# Patient Record
Sex: Female | Born: 1990 | Race: White | Hispanic: No | Marital: Single | Smoking: Never smoker
Health system: Southern US, Community
[De-identification: ages and names within clinical notes are randomized; demographics above are authoritative.]

## PROBLEM LIST (undated history)

## (undated) DIAGNOSIS — F909 Attention-deficit hyperactivity disorder, unspecified type: Secondary | ICD-10-CM

## (undated) DIAGNOSIS — F840 Autistic disorder: Secondary | ICD-10-CM

## (undated) DIAGNOSIS — Z975 Presence of (intrauterine) contraceptive device: Secondary | ICD-10-CM

## (undated) DIAGNOSIS — F429 Obsessive-compulsive disorder, unspecified: Secondary | ICD-10-CM

## (undated) DIAGNOSIS — F419 Anxiety disorder, unspecified: Secondary | ICD-10-CM

## (undated) HISTORY — DX: Presence of (intrauterine) contraceptive device: Z97.5

## (undated) HISTORY — DX: Attention-deficit hyperactivity disorder, unspecified type: F90.9

## (undated) HISTORY — DX: Obsessive-compulsive disorder, unspecified: F42.9

## (undated) HISTORY — DX: Anxiety disorder, unspecified: F41.9

---
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
## 2010-06-14 ENCOUNTER — Other Ambulatory Visit: Payer: Self-pay | Admitting: Internal Medicine

## 2010-06-14 DIAGNOSIS — R569 Unspecified convulsions: Secondary | ICD-10-CM

## 2010-06-21 ENCOUNTER — Ambulatory Visit (HOSPITAL_COMMUNITY)
Admission: RE | Admit: 2010-06-21 | Discharge: 2010-06-21 | Disposition: A | Discharge status: Home / Self Care | Payer: BLUE CROSS/BLUE SHIELD | Source: Ambulatory Visit | Attending: Neurology | Admitting: Neurology

## 2010-06-21 DIAGNOSIS — R569 Unspecified convulsions: Secondary | ICD-10-CM | POA: Insufficient documentation

## 2010-06-21 DIAGNOSIS — Z1389 Encounter for screening for other disorder: Secondary | ICD-10-CM | POA: Insufficient documentation

## 2010-06-23 NOTE — Procedures (Unsigned)
EEG NUMBER:  HISTORY:  This is a 19 year old woman with Asperger syndrome who had seizures from the age of 36 until middle school and had been seizures free being on Depakote since middle school.  EEG has been performed for further evaluation.  LIST OF MEDICATIONS:  None.  EEG RECORDING TIME:  25.5 minutes of EEG recording.  EEG DESCRIPTION:  This is a routine 18-channel adult EEG recording with one channel devoted to limited EKG recording.  Activation procedure was performed using the hyperventilation and photic stimulation, and the study is performed in the awake state. As the EEG opens up, I noticed that the posterior dominant rhythm is well formed into 9 Hz ranges and amplitude ranging from 10 to 25 microvolts.  The rhythm is symmetrical and continuous.  I do notice the build up of generalized high-amplitude relative slowing with the hyperventilation but it did not bring on any epileptogenic activity. Symmetrical driving was noted with the photic stimulation in posterior leads.  There are no electrographic seizures or epileptiform discharges recorded during this study.  There is no finding suggestive of pattern pointing towards the possibility of primary generalized epilepsy seen on this study either.  EEG INTERPRETATION:  This is a normal awake EEG.  A normal EEG does not rule out the possibility of having seizure disorder; therefore, clinical correlation is advised.          ______________________________ Levie Heritage, MD    ZO:[id number] D:  06/22/2010 18:08:20  T:  06/23/2010 04:57:16  Job #:  [id number]

## 2010-07-05 ENCOUNTER — Other Ambulatory Visit: Payer: BLUE CROSS/BLUE SHIELD

## 2016-07-14 ENCOUNTER — Encounter: Payer: Self-pay | Admitting: Obstetrics & Gynecology

## 2016-07-14 ENCOUNTER — Ambulatory Visit (INDEPENDENT_AMBULATORY_CARE_PROVIDER_SITE_OTHER): Payer: Medicaid Other | Admitting: Obstetrics & Gynecology

## 2016-07-14 VITALS — BP 118/78 | Ht 64.0 in | Wt 117.0 lb

## 2016-07-14 DIAGNOSIS — Z01419 Encounter for gynecological examination (general) (routine) without abnormal findings: Secondary | ICD-10-CM

## 2016-07-14 DIAGNOSIS — Z3046 Encounter for surveillance of implantable subdermal contraceptive: Secondary | ICD-10-CM | POA: Diagnosis not present

## 2016-07-14 DIAGNOSIS — Z0000 Encounter for general adult medical examination without abnormal findings: Secondary | ICD-10-CM | POA: Diagnosis not present

## 2016-07-14 NOTE — Progress Notes (Signed)
    Michelle Wong January 07, 1991 [id number]   History:    26 y.o. G0 Autistic.  Nexplanon x 11/2014  RP: Established patient for annual gyn exam   HPI:  Menses light q1-3 months.  Mild dysmeno using Tylenol PRN.  No Pelvic Pain.  No vaginal d/c.  Patient report being sexually active once in her life (private information).    Past medical history,surgical history, family history and social history were all reviewed and documented in the EPIC chart.  Gynecologic History No LMP recorded. Contraception: Nexplanon Last Pap: 10/2014. Results were: normal Last mammogram: Never.   Obstetric History OB History  Gravida Para Term Preterm AB Living  0 0 0 0 0 0  SAB TAB Ectopic Multiple Live Births  0 0 0 0 0         ROS: A ROS was performed and pertinent positives and negatives are included in the history.  GENERAL: No fevers or chills. HEENT: No change in vision, no earache, sore throat or sinus congestion. NECK: No pain or stiffness. CARDIOVASCULAR: No chest pain or pressure. No palpitations. PULMONARY: No shortness of breath, cough or wheeze. GASTROINTESTINAL: No abdominal pain, nausea, vomiting or diarrhea, melena or bright red blood per rectum. GENITOURINARY: No urinary frequency, urgency, hesitancy or dysuria. MUSCULOSKELETAL: No joint or muscle pain, no back pain, no recent trauma. DERMATOLOGIC: No rash, no itching, no lesions. ENDOCRINE: No polyuria, polydipsia, no heat or cold intolerance. No recent change in weight. HEMATOLOGICAL: No anemia or easy bruising or bleeding. NEUROLOGIC: No headache, seizures, numbness, tingling or weakness. PSYCHIATRIC: No depression, no loss of interest in normal activity or change in sleep pattern.     Exam:   BP 118/78   Ht 5\' 4"  (1.626 m)   Wt 117 lb (53.1 kg)   BMI 20.08 kg/m   Body mass index is 20.08 kg/m.  General appearance : Well developed well nourished female. No acute distress HEENT: Eyes: no retinal hemorrhage or exudates,  Neck  supple, trachea midline, no carotid bruits, no thyroidmegaly Lungs: Clear to auscultation, no rhonchi or wheezes, or rib retractions  Heart: Regular rate and rhythm, no murmurs or gallops Breast:Examined in sitting and supine position were symmetrical in appearance, no palpable masses or tenderness,  no skin retraction, no nipple inversion, no nipple discharge, no skin discoloration, no axillary or supraclavicular lymphadenopathy Abdomen: no palpable masses or tenderness, no rebound or guarding Extremities: no edema or skin discoloration or tenderness  Pelvic:  Bartholin, Urethra, Skene Glands: Within normal limits             Vagina: No gross lesions or discharge  Cervix: No gross lesions or discharge.  Pap done with virgin speculum.  Uterus  AV, normal size, shape and consistency, non-tender and mobile  Adnexa  Without masses or tenderness  Anus and perineum  normal    Assessment/Plan:  26 y.o. female for annual exam   1. Well female exam with routine gynecological exam Normal gyn exam.  Pap reflex done. - PAP,TP IMGw/HPV RNA,rflx HPVTYPE16,18/45  2. Encounter for surveillance of Nexplanon subdermal contraceptive Well on Nexplanon x 11/2014.  Michelle DelMarie-Lyne Leiya Keesey MD, 4:00 PM 07/14/2016

## 2016-07-14 NOTE — Patient Instructions (Signed)
1. Well female exam with routine gynecological exam Normal gyn exam.  Pap reflex done. - PAP,TP IMGw/HPV RNA,rflx HPVTYPE16,18/45  2. Encounter for surveillance of Nexplanon subdermal contraceptive Well on Nexplanon x 11/2014.  Orel, it was a pleasure to see you today!  I will inform you of your results as soon as available.

## 2016-07-18 LAB — PAP, TP IMAGING W/ HPV RNA, RFLX HPV TYPE 16,18/45: HPV mRNA, High Risk: NOT DETECTED

## 2017-07-25 ENCOUNTER — Encounter: Payer: Self-pay | Admitting: Obstetrics & Gynecology

## 2017-08-09 ENCOUNTER — Encounter: Payer: Self-pay | Admitting: Obstetrics & Gynecology

## 2017-08-09 ENCOUNTER — Ambulatory Visit (INDEPENDENT_AMBULATORY_CARE_PROVIDER_SITE_OTHER): Payer: Medicaid Other | Admitting: Obstetrics & Gynecology

## 2017-08-09 VITALS — BP 126/80 | Ht 64.0 in | Wt 116.0 lb

## 2017-08-09 DIAGNOSIS — Z3046 Encounter for surveillance of implantable subdermal contraceptive: Secondary | ICD-10-CM | POA: Diagnosis not present

## 2017-08-09 DIAGNOSIS — Z01419 Encounter for gynecological examination (general) (routine) without abnormal findings: Secondary | ICD-10-CM | POA: Diagnosis not present

## 2017-08-09 DIAGNOSIS — Z0000 Encounter for general adult medical examination without abnormal findings: Secondary | ICD-10-CM | POA: Diagnosis not present

## 2017-08-09 NOTE — Patient Instructions (Signed)
1. Encounter for routine gynecological examination with Papanicolaou smear of cervix Normal gynecologic exam.  Pap reflex done.  Breast exam normal.  Good body mass index at 19.91. - Pap IG w/ reflex to HPV when ASC-U  2. Encounter for surveillance of implantable subdermal contraceptive Well on Nexplanon.  Will follow up to change Nexplanon in September 2019.  Michelle Wong, it was very nice seeing you today!  I will inform you of your results as soon as they are available.

## 2017-08-09 NOTE — Progress Notes (Signed)
    Michelle Wong 02/04/1991 [name]   History:    27 y.o. G0 Autism.  Presents accompanied by her mother.  RP:  Established patient presenting for annual gyn exam   HPI: Well on Nexplanon with light menses every months.  No pelvic pain.  Normal vaginal secretions.  Currently abstinent.  Urine and bowel movements normal.  Breasts normal.  Body mass index 19.91.  Past medical history,surgical history, family history and social history were all reviewed and documented in the EPIC chart.  Gynecologic History Patient's last menstrual period was 07/18/2017. Contraception: Nexplanon x 11/2014 Last Pap: 07/2016. Results were: Negative, HPV HR neg Last mammogram: Never Bone Density: Never Colonoscopy: Never  Obstetric History OB History  Gravida Para Term Preterm AB Living  0 0 0 0 0 0  SAB TAB Ectopic Multiple Live Births  0 0 0 0 0     ROS: A ROS was performed and pertinent positives and negatives are included in the history.  GENERAL: No fevers or chills. HEENT: No change in vision, no earache, sore throat or sinus congestion. NECK: No pain or stiffness. CARDIOVASCULAR: No chest pain or pressure. No palpitations. PULMONARY: No shortness of breath, cough or wheeze. GASTROINTESTINAL: No abdominal pain, nausea, vomiting or diarrhea, melena or bright red blood per rectum. GENITOURINARY: No urinary frequency, urgency, hesitancy or dysuria. MUSCULOSKELETAL: No joint or muscle pain, no back pain, no recent trauma. DERMATOLOGIC: No rash, no itching, no lesions. ENDOCRINE: No polyuria, polydipsia, no heat or cold intolerance. No recent change in weight. HEMATOLOGICAL: No anemia or easy bruising or bleeding. NEUROLOGIC: No headache, seizures, numbness, tingling or weakness. PSYCHIATRIC: No depression, no loss of interest in normal activity or change in sleep pattern.     Exam:   BP 126/80 (BP Location: Right Arm, Patient Position: Sitting, Cuff Size: Normal)   Ht 5\' 4"  (1.626 m)   Wt 116 lb  (52.6 kg)   LMP 07/18/2017   BMI 19.91 kg/m   Body mass index is 19.91 kg/m.  General appearance : Well developed well nourished female. No acute distress HEENT: Eyes: no retinal hemorrhage or exudates,  Neck supple, trachea midline, no carotid bruits, no thyroidmegaly Lungs: Clear to auscultation, no rhonchi or wheezes, or rib retractions  Heart: Regular rate and rhythm, no murmurs or gallops Breast:Examined in sitting and supine position were symmetrical in appearance, no palpable masses or tenderness,  no skin retraction, no nipple inversion, no nipple discharge, no skin discoloration, no axillary or supraclavicular lymphadenopathy Abdomen: no palpable masses or tenderness, no rebound or guarding Extremities: no edema or skin discoloration or tenderness  Pelvic: Vulva: Normal             Vagina: No gross lesions or discharge  Cervix: No gross lesions or discharge.  Pap reflex done.  Uterus  AV, normal size, shape and consistency, non-tender and mobile  Adnexa  Without masses or tenderness  Anus: Normal   Assessment/Plan:  27 y.o. female for annual exam   1. Encounter for routine gynecological examination with Papanicolaou smear of cervix Normal gynecologic exam.  Pap reflex done.  Breast exam normal.  Good body mass index at 19.91. - Pap IG w/ reflex to HPV when ASC-U  2. Encounter for surveillance of implantable subdermal contraceptive Well on Nexplanon.  Will follow up to change Nexplanon in September 2019.  Michelle DelMarie-Lyne Elyssia Strausser MD, 4:40 PM 08/09/2017

## 2017-08-09 NOTE — Progress Notes (Signed)
lab

## 2017-08-13 LAB — PAP IG W/ RFLX HPV ASCU

## 2017-12-03 ENCOUNTER — Encounter: Payer: Self-pay | Admitting: Obstetrics & Gynecology

## 2017-12-03 ENCOUNTER — Ambulatory Visit (INDEPENDENT_AMBULATORY_CARE_PROVIDER_SITE_OTHER): Payer: Medicaid Other | Admitting: Obstetrics & Gynecology

## 2017-12-03 DIAGNOSIS — Z3046 Encounter for surveillance of implantable subdermal contraceptive: Secondary | ICD-10-CM

## 2017-12-03 DIAGNOSIS — Z3049 Encounter for surveillance of other contraceptives: Secondary | ICD-10-CM

## 2017-12-03 DIAGNOSIS — Z30017 Encounter for initial prescription of implantable subdermal contraceptive: Secondary | ICD-10-CM

## 2017-12-03 DIAGNOSIS — Z23 Encounter for immunization: Secondary | ICD-10-CM

## 2017-12-03 NOTE — Patient Instructions (Signed)
1. Encounter for Nexplanon removal Easy Nexplanon removal as described above.  No complication.  2. Nexplanon insertion Easy Nexplanon insertion as described above.  No complication.  Kenlyn, so good seeing you today!  You are a very strong woman!

## 2017-12-03 NOTE — Addendum Note (Signed)
Addended by: Berna Spare A on: 12/03/2017 05:01 PM   Modules accepted: Orders

## 2017-12-03 NOTE — Progress Notes (Signed)
Michelle Wong 20-Jan-1991 [id number]        27 y.o.  G0P0000 Autism.  Accompanied by mother.  RP: Remove Nexplanon/Insert new Nexplanon  HPI: Time to change Nexplanon.  No BTB.  No pelvic pain.   OB History  Gravida Para Term Preterm AB Living  0 0 0 0 0 0  SAB TAB Ectopic Multiple Live Births  0 0 0 0 0    Past medical history,surgical history, problem list, medications, allergies, family history and social history were all reviewed and documented in the EPIC chart.   Directed ROS with pertinent positives and negatives documented in the history of present illness/assessment and plan.  Exam:  There were no vitals filed for this visit. General appearance:  Normal                                                             Nexplanon procedure note (removal)  The patient presented to the office today requesting for removal of her Nexplanon that was placed in the year 11/2014 on her left arm.   On examination the nexplanon implant was palpated and the distal end  (end  closest to the elbow) was marked. The area was sterilized with Betadine solution. 1% lidocaine was used for local anesthesia and approximately 1 cc  was injected into the site that was marked where the incision was to be made and along the planned insertion tunnel. . The local anesthetic was injected under the implant in an effort to keep it  close to the skin surface. Slight pressure pushing downward was made at the proximal end  of the implant in an effort to stabilize it. A bulge appeared indicating the distal end of the implant. A small transverse incision of 2 mm was made at that location. By gently pushing the implant toward the incision, the tip became visible. Grasping the implant with a curved forcep facilitated in gently removing the implant. Full confirmation of the entire implant which is 4 cm long was inspected and was intact and was shown to the patient and discarded.        Nexplanon Procedure Note (insertion)   The preloaded disposable Nexplanon was removed from its sterile casing.  The applicator was held above the needle at the textured surface area. The transparent protector was removed.  The tip of the needle was inserted in the incision angled at 30. The Nexplanon applicator was lowered to a horizontal position. While lifting the skin with the tip of the needle the needle was then slid to its full length. The applicator was kept in this position with the needle inserted to its full length. The purple slider was unlocked by pushing it slightly downward. The slider was fully moved back until it stopped. This allowed the implant to be in the final subdermal position and the needle to be locked inside the body of the applicator. The applicator was then removed. 3 Steri-Strips were applied over the incision, a band-aid and a bandage was placed which the patient is to remove tomorrow. No complications, the patient tolerated procedure well and was released home with instructions.   Assessment/Plan:  27 y.o. G0P0000   1. Encounter for Nexplanon removal Easy Nexplanon removal as described above.  No complication.  2. Nexplanon insertion  Easy Nexplanon insertion as described above.  No complication.  Precautions post Nexplanon removal and insertion reviewed.  Follow-up for annual gynecologic exam when due.  Genia Del MD, 3:56 PM 12/03/2017

## 2017-12-05 ENCOUNTER — Encounter: Payer: Self-pay | Admitting: Anesthesiology

## 2020-03-28 ENCOUNTER — Other Ambulatory Visit: Payer: Self-pay

## 2020-03-28 ENCOUNTER — Emergency Department
Admission: EM | Admit: 2020-03-28 | Discharge: 2020-03-28 | Disposition: A | Discharge status: Home / Self Care | Payer: Medicaid Other | Attending: Emergency Medicine | Admitting: Emergency Medicine

## 2020-03-28 ENCOUNTER — Emergency Department: Payer: Medicaid Other

## 2020-03-28 ENCOUNTER — Encounter: Payer: Self-pay | Admitting: Emergency Medicine

## 2020-03-28 DIAGNOSIS — Y9341 Activity, dancing: Secondary | ICD-10-CM | POA: Insufficient documentation

## 2020-03-28 DIAGNOSIS — Y92009 Unspecified place in unspecified non-institutional (private) residence as the place of occurrence of the external cause: Secondary | ICD-10-CM | POA: Diagnosis not present

## 2020-03-28 DIAGNOSIS — S93402A Sprain of unspecified ligament of left ankle, initial encounter: Secondary | ICD-10-CM | POA: Insufficient documentation

## 2020-03-28 DIAGNOSIS — X501XXA Overexertion from prolonged static or awkward postures, initial encounter: Secondary | ICD-10-CM | POA: Diagnosis not present

## 2020-03-28 DIAGNOSIS — S99912A Unspecified injury of left ankle, initial encounter: Secondary | ICD-10-CM | POA: Diagnosis present

## 2020-03-28 HISTORY — DX: Autistic disorder: F84.0

## 2020-03-28 NOTE — ED Provider Notes (Signed)
[facility] Emergency Department Provider Note  ____________________________________________   Event Date/Time   First MD Initiated Contact with Patient 03/28/20 1345     (approximate)  I have reviewed the triage vital signs and the nursing notes.   HISTORY  Chief Complaint Fall and Ankle Pain (left)    HPI Michelle Wong is a 30 y.o. female presents emergency department complaining of left ankle pain.  Patient states she twisted her ankle while dancing in the house.  This happened today prior to arrival.  Pain is rated 10/10    Past Medical History:  Diagnosis Date  . ADHD   . Autism     There are no problems to display for this patient.   History reviewed. No pertinent surgical history.  Prior to Admission medications   Medication Sig Start Date End Date Taking? Authorizing Provider  etonogestrel (NEXPLANON) 68 MG IMPL implant 1 each by Subdermal route once.    [provider]    Allergies Patient has no known allergies.  No family history on file.  Social History Social History   Tobacco Use  . Smoking status: Never Smoker  . Smokeless tobacco: Never Used  Vaping Use  . Vaping Use: Never used  Substance Use Topics  . Alcohol use: No  . Drug use: Not Currently    Types: Other-see comments    Review of Systems  Constitutional: No fever/chills Eyes: No visual changes. ENT: No sore throat. Respiratory: Denies cough Genitourinary: Negative for dysuria. Musculoskeletal: Negative for back pain.  Positive for left ankle pain Skin: Negative for rash. Psychiatric: no mood changes,     ____________________________________________   PHYSICAL EXAM:  VITAL SIGNS: ED Triage Vitals  Enc Vitals Group     BP 03/28/20 1158 103/74     Pulse Rate 03/28/20 1158 69     Resp 03/28/20 1158 16     Temp 03/28/20 1158 (!) 97.4 F (36.3 C)     Temp Source 03/28/20 1158 Oral     SpO2 03/28/20 1158 100 %     Weight --       Height --      Head Circumference --      Peak Flow --      Pain Score 03/28/20 1154 9     Pain Loc --      Pain Edu? --      Excl. in GC? --     Constitutional: Alert and oriented. Well appearing and in no acute distress. Eyes: Conjunctivae are normal.  Head: Atraumatic. Nose: No congestion/rhinnorhea. Mouth/Throat: Mucous membranes are moist.  Neck:  supple no lymphadenopathy noted Cardiovascular: Normal rate, regular rhythm.  Respiratory: Normal respiratory effort.  No retractions, GU: deferred Musculoskeletal:  all extremities, warm and well perfused, left ankle is very swollen and tender laterally, neurovascular is intact, patient is able to move toes without difficulty, however she is guarding the left ankle Neurologic:  Normal speech and language.  Skin:  Skin is warm, dry and intact. No rash noted. Psychiatric: Mood and affect are normal. Speech and behavior are normal.  ____________________________________________   LABS (all labs ordered are listed, but only abnormal results are displayed)  Labs Reviewed - No data to display ____________________________________________   ____________________________________________  RADIOLOGY  X-ray of the left ankle is negative for fracture  ____________________________________________   PROCEDURES  Procedure(s) performed: Ace wrap and stirrup splint applied by me, neurovascularly intact post splint application   Procedures    ____________________________________________  INITIAL IMPRESSION / ASSESSMENT AND PLAN / ED COURSE  Pertinent labs & imaging results that were available during my care of the patient were reviewed by me and considered in my medical decision making (see chart for details).   Patient is 30 year old female presents with her father to the emergency department with left ankle pain from an injury prior to arrival.  See HPI.  Physical exam shows patient to appear well.  X-ray of the left ankle does  not show fracture but shows soft tissue swelling.  This was reviewed by me and confirmed by radiology.  Patient was placed in a Ace wrap and stirrup splint by me.  They are to follow-up with orthopedics in 1 week if not improving.  She was discharged stable condition in the care of her father     Michelle Wong was evaluated in Emergency Department on 03/28/2020 for the symptoms described in the history of present illness. She was evaluated in the context of the global COVID-19 pandemic, which necessitated consideration that the patient might be at risk for infection with the SARS-CoV-2 virus that causes COVID-19. Institutional protocols and algorithms that pertain to the evaluation of patients at risk for COVID-19 are in a state of rapid change based on information released by regulatory bodies including the CDC and federal and state organizations. These policies and algorithms were followed during the patient's care in the ED.    As part of my medical decision making, I reviewed the following data within the electronic MEDICAL RECORD NUMBER Nursing notes reviewed and incorporated, Old chart reviewed, Radiograph reviewed , Notes from prior ED visits and [place] Controlled Substance Database  ____________________________________________   FINAL CLINICAL IMPRESSION(S) / ED DIAGNOSES  Final diagnoses:  Sprain of left ankle, unspecified ligament, initial encounter      NEW MEDICATIONS STARTED DURING THIS VISIT:  New Prescriptions   No medications on file     Note:  This document was prepared using Dragon voice recognition software and may include unintentional dictation errors.    Faythe Ghee, PA-C 03/28/20 1408    Jene Every, MD 03/28/20 [phone]

## 2020-03-28 NOTE — ED Triage Notes (Signed)
Pt to ED via POV for fall with left ankle pain. Pt is in NAD

## 2020-03-28 NOTE — Discharge Instructions (Addendum)
Take Tylenol and ibuprofen for pain.  Elevate and ice.  Return emergency department worsening.  Follow-up with orthopedics if not better in 1 week

## 2020-12-09 ENCOUNTER — Other Ambulatory Visit: Payer: Self-pay

## 2020-12-09 ENCOUNTER — Other Ambulatory Visit (HOSPITAL_COMMUNITY)
Admission: RE | Admit: 2020-12-09 | Discharge: 2020-12-09 | Disposition: A | Discharge status: Home / Self Care | Payer: Medicaid Other | Source: Ambulatory Visit | Attending: Obstetrics & Gynecology | Admitting: Obstetrics & Gynecology

## 2020-12-09 ENCOUNTER — Ambulatory Visit (INDEPENDENT_AMBULATORY_CARE_PROVIDER_SITE_OTHER): Payer: Medicaid Other | Admitting: Obstetrics & Gynecology

## 2020-12-09 ENCOUNTER — Encounter: Payer: Self-pay | Admitting: Obstetrics & Gynecology

## 2020-12-09 VITALS — BP 104/64 | HR 81 | Resp 16 | Ht 63.25 in | Wt 101.0 lb

## 2020-12-09 DIAGNOSIS — Z3046 Encounter for surveillance of implantable subdermal contraceptive: Secondary | ICD-10-CM

## 2020-12-09 DIAGNOSIS — Z01419 Encounter for gynecological examination (general) (routine) without abnormal findings: Secondary | ICD-10-CM | POA: Diagnosis present

## 2020-12-09 DIAGNOSIS — Z0000 Encounter for general adult medical examination without abnormal findings: Secondary | ICD-10-CM

## 2020-12-09 NOTE — Progress Notes (Signed)
    DOT SPLINTER [birth year] [id number]   History:    30 y.o. . G0 Autism.  Presents accompanied by her mother.   RP:  Established patient presenting for annual gyn exam    HPI: Well on Nexplanon x 12/03/2017 with light menses every month.  No pelvic pain.  Normal vaginal secretions.  Currently abstinent.  Urine and bowel movements normal.  Breasts normal.  Body mass index 17.75.    Past medical history,surgical history, family history and social history were all reviewed and documented in the EPIC chart.  Gynecologic History No LMP recorded. Patient has had an implant.  Obstetric History OB History  Gravida Para Term Preterm AB Living  0 0 0 0 0 0  SAB IAB Ectopic Multiple Live Births  0 0 0 0 0     ROS: A ROS was performed and pertinent positives and negatives are included in the history.  GENERAL: No fevers or chills. HEENT: No change in vision, no earache, sore throat or sinus congestion. NECK: No pain or stiffness. CARDIOVASCULAR: No chest pain or pressure. No palpitations. PULMONARY: No shortness of breath, cough or wheeze. GASTROINTESTINAL: No abdominal pain, nausea, vomiting or diarrhea, melena or bright red blood per rectum. GENITOURINARY: No urinary frequency, urgency, hesitancy or dysuria. MUSCULOSKELETAL: No joint or muscle pain, no back pain, no recent trauma. DERMATOLOGIC: No rash, no itching, no lesions. ENDOCRINE: No polyuria, polydipsia, no heat or cold intolerance. No recent change in weight. HEMATOLOGICAL: No anemia or easy bruising or bleeding. NEUROLOGIC: No headache, seizures, numbness, tingling or weakness. PSYCHIATRIC: No depression, no loss of interest in normal activity or change in sleep pattern.     Exam:   BP 104/64   Pulse 81   Resp 16   Ht 5' 3.25" (1.607 m)   Wt 101 lb (45.8 kg)   BMI 17.75 kg/m   Body mass index is 17.75 kg/m.  General appearance : Well developed well nourished female. No acute distress HEENT: Eyes: no retinal hemorrhage or  exudates,  Neck supple, trachea midline, no carotid bruits, no thyroidmegaly Lungs: Clear to auscultation, no rhonchi or wheezes, or rib retractions  Heart: Regular rate and rhythm, no murmurs or gallops Breast:Examined in sitting and supine position were symmetrical in appearance, no palpable masses or tenderness,  no skin retraction, no nipple inversion, no nipple discharge, no skin discoloration, no axillary or supraclavicular lymphadenopathy Abdomen: no palpable masses or tenderness, no rebound or guarding Extremities: no edema or skin discoloration or tenderness  Pelvic: Vulva: Normal             Vagina: No gross lesions or discharge.  Mild dark menstrual flow.  Cervix: No gross lesions or discharge.  Pap reflex done.  Uterus  AV, normal size, shape and consistency, non-tender and mobile  Adnexa  Without masses or tenderness  Anus: Normal   Assessment/Plan:  30 y.o. female for annual exam   1. Encounter for routine gynecological examination with Papanicolaou smear of cervix Normal gynecologic exam in menopause.  Pap reflex done.  Breast exam normal.  Body mass index 17.75.  Mild increase in calorie recommended.  Continue with fitness. - Cytology - PAP( [place])  2. Encounter for surveillance of implantable subdermal contraceptive F/U removal/insertion on Nexplanon.  Other orders - clonazePAM (KLONOPIN) 0.5 MG tablet; Take 0.25 mg by mouth 2 (two) times daily as needed.   Genia Del MD, 9:24 AM 12/09/2020

## 2020-12-12 ENCOUNTER — Encounter: Payer: Self-pay | Admitting: Obstetrics & Gynecology

## 2020-12-13 LAB — CYTOLOGY - PAP: Diagnosis: NEGATIVE

## 2020-12-30 ENCOUNTER — Other Ambulatory Visit: Payer: Self-pay | Admitting: Obstetrics & Gynecology

## 2020-12-30 DIAGNOSIS — Z3046 Encounter for surveillance of implantable subdermal contraceptive: Secondary | ICD-10-CM

## 2020-12-30 DIAGNOSIS — Z30017 Encounter for initial prescription of implantable subdermal contraceptive: Secondary | ICD-10-CM

## 2021-01-12 ENCOUNTER — Ambulatory Visit (INDEPENDENT_AMBULATORY_CARE_PROVIDER_SITE_OTHER): Payer: Medicaid Other | Admitting: Obstetrics & Gynecology

## 2021-01-12 ENCOUNTER — Other Ambulatory Visit: Payer: Self-pay

## 2021-01-12 ENCOUNTER — Encounter: Payer: Self-pay | Admitting: Obstetrics & Gynecology

## 2021-01-12 DIAGNOSIS — Z3046 Encounter for surveillance of implantable subdermal contraceptive: Secondary | ICD-10-CM

## 2021-01-12 DIAGNOSIS — Z30017 Encounter for initial prescription of implantable subdermal contraceptive: Secondary | ICD-10-CM

## 2021-01-12 NOTE — Progress Notes (Signed)
Michelle Wong 1990/05/23 [id number]        30 y.o.  G0P0000   RP: Remove Nexplanon/Insert new Nexplanon  HPI: Time to switch to a new Nexplanon.  Abstinent.  No BTB.  No pelvic pain.   OB History  Gravida Para Term Preterm AB Living  0 0 0 0 0 0  SAB IAB Ectopic Multiple Live Births  0 0 0 0 0    Past medical history,surgical history, problem list, medications, allergies, family history and social history were all reviewed and documented in the EPIC chart.   Directed ROS with pertinent positives and negatives documented in the history of present illness/assessment and plan.  Exam:  Vitals:   01/12/21 1153  BP: 110/72   General appearance:  Normal                                                             Nexplanon procedure note (removal)  The patient presented to the office today requesting for removal of her Nexplanon that was placed in the year 2019 on her Left arm.   On examination the nexplanon implant was palpated and the distal end  (end  closest to the elbow) was marked. The area was sterilized with Betadine solution. 1% lidocaine was used for local anesthesia and approximately 1 cc  was injected into the site that was marked where the incision was to be made. The local anesthetic was injected under the implant in an effort to keep it  close to the skin surface. Slight pressure pushing downward was made at the proximal end  of the implant in an effort to stabilize it. A bulge appeared indicating the distal end of the implant. A small transverse incision of 2 mm was made at that location. By gently pushing the implant toward the incision, the tip became visible. Grasping the implant with a curved forcep facilitated in gently removing the implant. Full confirmation of the entire implant which is 4 cm long was inspected and was intact and was shown to the patient and discarded. After removing the implant, the incision was closed with 3Steri-Strips, a band-aid and a bandage.  Patient will be instructed to remove the pressure bandage in 24 hours, the band-aid in 3 days and the Steri-Strips in 7 days.                                               Nexplanon Procedure Note (insertion)   The patient was laying on her back with her nondominant arm flexed at the elbow and externally rotated. The insertion site was identified as the underside of the nondominant upper arm approximately 8 cm from the medial epicondyle of the humerus.  A mark was made with a sterile marker at the spot where the Nexplanon implant will be inserted. The area was cleansed with Betadine solution. The area was anesthetized with 1% lidocaine  (1 cc)  at the area the injection site and underneath the skin along the planned insertion tunnel. The preloaded disposable Nexplanon was removed from its sterile casing.  The applicator was held above the needle at the textured surface area. The transparent protector  was removed. With a freehand, the skin was stretched around the insertion site with a thumb and index finger. The skin was then punctured with the tip of the needle angled at 30. The Nexplanon applicator was lowered to a horizontal position. While lifting the skin with the tip of the needle the needle was then slid to its full length. The applicator was kept in this position with the needle inserted to its full length. The purple slider was unlocked by pushing it slightly downward. The slider was fully moved back until it stopped. This allowed the implant to be in the final subdermal position and the needle to be locked inside the body of the applicator. The applicator was then removed. 3 Steri-Strips were applied over the incision, a band-aid and a bandage was placed which the patient is to remove tomorrow. No complications, the patient tolerated procedure well and was released home with instructions.     Assessment/Plan:  30 y.o. G0P0000   1. Nexplanon removal Time to remove Nexplanon.  Easy removal of  Nexplanon with no complication.  Well-tolerated by patient. - Removal of implanon rod  2. Nexplanon insertion Insertion of a new Nexplanon.  Easy insertion with no complication.  Well-tolerated by patient.  Post procedure precautions reviewed.  Information shared with patient's mother as well. - Insertion of implanon rod   Genia Del MD, 11:59 AM 01/12/2021

## 2021-09-24 ENCOUNTER — Other Ambulatory Visit: Payer: Self-pay

## 2021-09-24 ENCOUNTER — Emergency Department
Admission: EM | Admit: 2021-09-24 | Discharge: 2021-09-24 | Disposition: A | Discharge status: Home / Self Care | Payer: Medicaid Other | Attending: Student in an Organized Health Care Education/Training Program | Admitting: Student in an Organized Health Care Education/Training Program

## 2021-09-24 DIAGNOSIS — F419 Anxiety disorder, unspecified: Secondary | ICD-10-CM | POA: Diagnosis present

## 2021-09-24 DIAGNOSIS — Y900 Blood alcohol level of less than 20 mg/100 ml: Secondary | ICD-10-CM | POA: Insufficient documentation

## 2021-09-24 DIAGNOSIS — F840 Autistic disorder: Secondary | ICD-10-CM | POA: Insufficient documentation

## 2021-09-24 LAB — COMPREHENSIVE METABOLIC PANEL
ALT: 10 U/L (ref 0–44)
AST: 16 U/L (ref 15–41)
Albumin: 4 g/dL (ref 3.5–5.0)
Alkaline Phosphatase: 49 U/L (ref 38–126)
Anion gap: 6 (ref 5–15)
BUN: 8 mg/dL (ref 6–20)
CO2: 25 mmol/L (ref 22–32)
Calcium: 9.1 mg/dL (ref 8.9–10.3)
Chloride: 106 mmol/L (ref 98–111)
Creatinine, Ser: 0.65 mg/dL (ref 0.44–1.00)
GFR, Estimated: >60 mL/min (ref >60)
Glucose, Bld: 100 mg/dL — ABNORMAL HIGH (ref 70–99)
Potassium: 3.7 mmol/L (ref 3.5–5.1)
Sodium: 137 mmol/L (ref 135–145)
Total Bilirubin: 0.5 mg/dL (ref 0.3–1.2)
Total Protein: 6.9 g/dL (ref 6.5–8.1)

## 2021-09-24 LAB — CBC WITH DIFFERENTIAL/PLATELET
Abs Immature Granulocytes: 0 10*3/uL (ref 0.00–0.07)
Basophils Absolute: 0 10*3/uL (ref 0.0–0.1)
Basophils Relative: 1 %
Eosinophils Absolute: 0 10*3/uL (ref 0.0–0.5)
Eosinophils Relative: 0 %
HCT: 40.6 % (ref 36.0–46.0)
Hemoglobin: 13.2 g/dL (ref 12.0–15.0)
Immature Granulocytes: 0 %
Lymphocytes Relative: 31 %
Lymphs Abs: 1.2 10*3/uL (ref 0.7–4.0)
MCH: 29.9 pg (ref 26.0–34.0)
MCHC: 32.5 g/dL (ref 30.0–36.0)
MCV: 92.1 fL (ref 80.0–100.0)
Monocytes Absolute: 0.4 10*3/uL (ref 0.1–1.0)
Monocytes Relative: 10 %
Neutro Abs: 2.3 10*3/uL (ref 1.7–7.7)
Neutrophils Relative %: 58 %
Platelets: 264 10*3/uL (ref 150–400)
RBC: 4.41 MIL/uL (ref 3.87–5.11)
RDW: 11.9 % (ref 11.5–15.5)
WBC: 4 10*3/uL (ref 4.0–10.5)
nRBC: 0 % (ref 0.0–0.2)

## 2021-09-24 LAB — ACETAMINOPHEN LEVEL: Acetaminophen (Tylenol), Serum: <10 ug/mL — ABNORMAL LOW (ref 10–30)

## 2021-09-24 LAB — ETHANOL: Alcohol, Ethyl (B): <10 mg/dL (ref <10)

## 2021-09-24 LAB — SALICYLATE LEVEL: Salicylate Lvl: <7 mg/dL — ABNORMAL LOW (ref 7.0–30.0)

## 2021-09-24 NOTE — ED Provider Notes (Signed)
[facility] Provider Note    Event Date/Time   First MD Initiated Contact with Patient 09/24/21 Rickey Primus     (approximate)   History   Anxiety   HPI  Michelle Wong is a 31 y.o. female   with a history of autism disorder as well as anxiety presents to the ER for worsening anxiety over the past few weeks to months.  Has been followed by outpatient psychiatry.  Came to the ER today because she "wanted to be reassured that she was not can to die in the next few weeks.  ".  States that she has been having worsening anxiety about this over the past several weeks after recently staying in a different hotel and change of locations makes her symptoms worse.  Mother is here with her has no concern for self-harm and was hoping that some reassurance could be provided.  Does not feel that she requires inpatient psychiatric evaluation.  Patient denies any SI or HI      Physical Exam   Triage Vital Signs: ED Triage Vitals  Enc Vitals Group     BP 09/24/21 1454 117/89     Pulse Rate 09/24/21 1454 100     Resp 09/24/21 1454 19     Temp 09/24/21 1454 98.4 F (36.9 C)     Temp Source 09/24/21 1454 Oral     SpO2 09/24/21 1454 100 %     Weight 09/24/21 1809 107 lb (48.5 kg)     Height 09/24/21 1455 5\' 4"  (1.626 m)     Head Circumference --      Peak Flow --      Pain Score --      Pain Loc --      Pain Edu? --      Excl. in GC? --     Most recent vital signs: Vitals:   09/24/21 1454  BP: 117/89  Pulse: 100  Resp: 19  Temp: 98.4 F (36.9 C)  SpO2: 100%     Constitutional: Alert  Eyes: Conjunctivae are normal.  Head: Atraumatic. Nose: No congestion/rhinnorhea. Mouth/Throat: Mucous membranes are moist.   Neck: Painless ROM.  Cardiovascular:   Good peripheral circulation. Respiratory: Normal respiratory effort.  No retractions.  Gastrointestinal: Soft and nontender.  Musculoskeletal:  no deformity Neurologic:  MAE spontaneously. No gross focal neurologic  deficits are appreciated.  Skin:  Skin is warm, dry and intact. No rash noted. Psychiatric: Mood and affect are anxious.      ED Results / Procedures / Treatments   Labs (all labs ordered are listed, but only abnormal results are displayed) Labs Reviewed  SALICYLATE LEVEL - Abnormal; Notable for the following components:      Result Value   Salicylate Lvl <7.0 (*)    All other components within normal limits  COMPREHENSIVE METABOLIC PANEL - Abnormal; Notable for the following components:   Glucose, Bld 100 (*)    All other components within normal limits  ACETAMINOPHEN LEVEL - Abnormal; Notable for the following components:   Acetaminophen (Tylenol), Serum <10 (*)    All other components within normal limits  CBC WITH DIFFERENTIAL/PLATELET  ETHANOL  URINALYSIS, ROUTINE W REFLEX MICROSCOPIC  URINE DRUG SCREEN, QUALITATIVE ([facility] ONLY)  PREGNANCY, URINE     EKG  ED ECG REPORT I, 09/26/21, the attending physician, personally viewed and interpreted this ECG.   Date: 09/24/2021  EKG Time: 15:01  Rate: 85  Rhythm: sinus  Axis: right  Intervals:  iRBBB  ST&T Change: no stemi, no depressions    RADIOLOGY   PROCEDURES:  Critical Care performed:   Procedures   MEDICATIONS ORDERED IN ED: Medications - No data to display   IMPRESSION / MDM / ASSESSMENT AND PLAN / ED COURSE  I reviewed the triage vital signs and the nursing notes.                              Differential diagnosis includes, but is not limited to, anxiety, stress reaction, mood disorder, psychosis, SI, HI, electrolyte abnormality, anemia, dysrhythmia  Patient presented to the ER for evaluation of symptoms as described above.  Patient clinically well-appearing in no acute distress.  Her EKG is nonischemic no sign of preexcitation syndrome.  Discussed option with patient and family to stay for psychiatric evaluation but mother was actually on the phone with psychiatrist as an outpatient who  recommended increasing her dose of clonazepam.  They feel reassured and feel comfortable with discharge home.      FINAL CLINICAL IMPRESSION(S) / ED DIAGNOSES   Final diagnoses:  Anxiety     Rx / DC Orders   ED Discharge Orders     None        Note:  This document was prepared using Dragon voice recognition software and may include unintentional dictation errors.    Willy Eddy, MD 09/24/21 [phone]

## 2021-09-24 NOTE — ED Provider Triage Note (Signed)
Emergency Medicine Provider Triage Evaluation Note  Michelle Wong , a 31 y.o. female  was evaluated in triage.  Pt complains of anxiety. Unable to go on vacation as planned.  Voices are telling her that this summer that she might not live next several days.  Wants to be around for holidays.  Hx of ADHD and Autism.  Patient is currently seeing a psychiatrist per mother.  Review of Systems  Positive: Voices +,  + Autism Negative:   Physical Exam  There were no vitals taken for this visit. Gen:   Awake, no distress   Resp:  Normal effort  MSK:   Moves extremities without difficulty  Other:    Medical Decision Making  Medically screening exam initiated at 2:45 PM.  Appropriate orders placed.  Michelle Wong was informed that the remainder of the evaluation will be completed by another provider, this initial triage assessment does not replace that evaluation, and the importance of remaining in the ED until their evaluation is complete.     Tanina, Barb, PA-C 09/24/21 1512

## 2021-09-24 NOTE — ED Notes (Signed)
Patient discharge instructions given and explained by Dr. Roxan Hockey and then discharged.

## 2021-09-24 NOTE — ED Triage Notes (Addendum)
Patient to ER via Pov with family. Patient with autism. Reports that her summer plans were different this year, and because of this she has been having increasing anxiety. Reports that she is also experiencing auditory hallucinations, states that the voices are telling her that she is going to die or not wake up. Reports that this has been making her worry that her life is going to be cut short, states that she has never had any physical condition that would cut her life short before but that the voices are convincing her that she has a condition that is going to end her life. Reports that she has felt like she is going to die in April/ May.  States that she has been experiencing a heaviness in her chest, stomach, throat, and head all summer.   Has had some recent medication changes.

## 2021-09-28 ENCOUNTER — Emergency Department
Admission: EM | Admit: 2021-09-28 | Discharge: 2021-09-28 | Disposition: A | Discharge status: Home / Self Care | Payer: Medicaid Other

## 2021-09-28 NOTE — ED Notes (Signed)
First Nurse Note;  Pt is with mother at this time. Pt has a hx of Autism and was seen here last week for the same. Pt states after speaking with her mother, pt states she had plans to go to [facility] and would rather spend the day there. Denies any SI/HI.   Pt is not wishing to stay.

## 2021-12-14 ENCOUNTER — Ambulatory Visit (INDEPENDENT_AMBULATORY_CARE_PROVIDER_SITE_OTHER): Payer: Medicaid Other | Admitting: Obstetrics & Gynecology

## 2021-12-14 ENCOUNTER — Encounter: Payer: Self-pay | Admitting: Obstetrics & Gynecology

## 2021-12-14 VITALS — BP 104/64 | HR 60 | Ht 63.25 in | Wt 106.0 lb

## 2021-12-14 DIAGNOSIS — Z3046 Encounter for surveillance of implantable subdermal contraceptive: Secondary | ICD-10-CM | POA: Diagnosis not present

## 2021-12-14 DIAGNOSIS — Z01419 Encounter for gynecological examination (general) (routine) without abnormal findings: Secondary | ICD-10-CM

## 2021-12-14 NOTE — Progress Notes (Signed)
KARAH CARUTHERS 1990-05-02 [id number]   History:    31 y.o. G0 Autism.  Presents accompanied by her mother, but not present for the exam.   RP:  Established patient presenting for annual gyn exam    HPI: Well on Nexplanon x 01/2021.  No menses or BTB.  No pelvic pain.  Normal vaginal secretions.  Currently abstinent.  Pap Neg 12/2020.  No h/o abnormal Pap.  Will repeat Pap every 3 years.  Urine and bowel movements normal.  Breasts normal.  Body mass index 18.63.  Past medical history,surgical history, family history and social history were all reviewed and documented in the EPIC chart.  Gynecologic History No LMP recorded. Patient has had an implant.  Obstetric History OB History  Gravida Para Term Preterm AB Living  0 0 0 0 0 0  SAB IAB Ectopic Multiple Live Births  0 0 0 0 0     ROS: A ROS was performed and pertinent positives and negatives are included in the history. GENERAL: No fevers or chills. HEENT: No change in vision, no earache, sore throat or sinus congestion. NECK: No pain or stiffness. CARDIOVASCULAR: No chest pain or pressure. No palpitations. PULMONARY: No shortness of breath, cough or wheeze. GASTROINTESTINAL: No abdominal pain, nausea, vomiting or diarrhea, melena or bright red blood per rectum. GENITOURINARY: No urinary frequency, urgency, hesitancy or dysuria. MUSCULOSKELETAL: No joint or muscle pain, no back pain, no recent trauma. DERMATOLOGIC: No rash, no itching, no lesions. ENDOCRINE: No polyuria, polydipsia, no heat or cold intolerance. No recent change in weight. HEMATOLOGICAL: No anemia or easy bruising or bleeding. NEUROLOGIC: No headache, seizures, numbness, tingling or weakness. PSYCHIATRIC: No depression, no loss of interest in normal activity or change in sleep pattern.     Exam:   BP 104/64   Pulse 60   Ht 5' 3.25" (1.607 m)   Wt 106 lb (48.1 kg)   SpO2 99%   BMI 18.63 kg/m   Body mass index is 18.63 kg/m.  General appearance : Well  developed well nourished female. No acute distress HEENT: Eyes: no retinal hemorrhage or exudates,  Neck supple, trachea midline, no carotid bruits, no thyroidmegaly Lungs: Clear to auscultation, no rhonchi or wheezes, or rib retractions  Heart: Regular rate and rhythm, no murmurs or gallops Breast:Examined in sitting and supine position were symmetrical in appearance, no palpable masses or tenderness,  no skin retraction, no nipple inversion, no nipple discharge, no skin discoloration, no axillary or supraclavicular lymphadenopathy Abdomen: no palpable masses or tenderness, no rebound or guarding Extremities: no edema or skin discoloration or tenderness  Pelvic: Vulva: Normal             Vagina: No gross lesions or discharge  Cervix: No gross lesions or discharge  Uterus  AV, normal size, shape and consistency, non-tender and mobile  Adnexa  Without masses or tenderness  Anus: Normal   Assessment/Plan:  31 y.o. female for annual exam   1. Well female exam with routine gynecological exam Well on Nexplanon x 01/2021.  No menses or BTB.  No pelvic pain.  Normal vaginal secretions.  Currently abstinent.  Pap Neg 12/2020.  No h/o abnormal Pap.  Will repeat Pap every 3 years.  Urine and bowel movements normal.  Breasts normal.  Body mass index 18.63.  2. Encounter for surveillance of implantable subdermal contraceptive Well on Nexplanon x 01/2021.  No menses or BTB.  No pelvic pain.  Normal vaginal secretions.  Currently abstinent.  Other orders - clonazePAM (KLONOPIN) 2 MG tablet; Take 1-2 mg by mouth 2 (two) times daily as needed. Takes 1 in the a.m. & 1/2 in the pm - Lurasidone HCl 60 MG TABS; SMARTSIG:1 Tablet(s) By Mouth Every Evening   Princess Bruins MD, 10:05 AM 12/14/2021

## 2021-12-16 ENCOUNTER — Telehealth: Payer: Self-pay | Admitting: *Deleted

## 2021-12-16 ENCOUNTER — Ambulatory Visit (INDEPENDENT_AMBULATORY_CARE_PROVIDER_SITE_OTHER): Payer: Medicaid Other | Admitting: Radiology

## 2021-12-16 VITALS — BP 106/70

## 2021-12-16 DIAGNOSIS — K641 Second degree hemorrhoids: Secondary | ICD-10-CM | POA: Diagnosis not present

## 2021-12-16 MED ORDER — LIDOCAINE-HYDROCORTISONE ACE 1-1 % EX CREA: 1.0000 | TOPICAL_CREAM | Freq: Two times a day (BID) | CUTANEOUS | 0 refills | Status: DC

## 2021-12-16 NOTE — Telephone Encounter (Signed)
Patient called with complaints of bump on vagina, tender to touch. Patient would like to be seen today.

## 2021-12-16 NOTE — Progress Notes (Signed)
      Subjective: Michelle Wong is a 31 y.o. female who complains of a bump she noticed while washing this morning on her bottom. Painful to touch, no drainage.     Review of Systems  All other systems reviewed and are negative.   Past Medical History:  Diagnosis Date   ADHD    Autism    OCD (obsessive compulsive disorder)       Objective:  Today's Vitals   12/16/21 1026  BP: 106/70   There is no height or weight on file to calculate BMI.   Physical Exam Vitals reviewed. Exam conducted with a chaperone present.  Constitutional:      Appearance: Normal appearance. She is normal weight.  Genitourinary:    Rectum: External hemorrhoid present.     Comments: 2cm thrombosed hemorrhoid  Neurological:     Mental Status: She is alert.     Assessment:/Plan:   1. Grade II hemorrhoids  - Lidocaine-Hydrocortisone Ace 1-1 % CREA; Apply 1 Application topically 2 (two) times daily.  Dispense: 30 g; Refill: 0    Sitz baths, comfort measures reviewed with patient and her mother

## 2022-10-20 IMAGING — CR DG ANKLE COMPLETE 3+V*L*
3 series · 3 of 3 positions shown · non-contrast
Comparison: None.

CLINICAL DATA: Fall, left ankle pain

EXAM:
LEFT ANKLE COMPLETE - 3+ VIEW

[ankle ap]
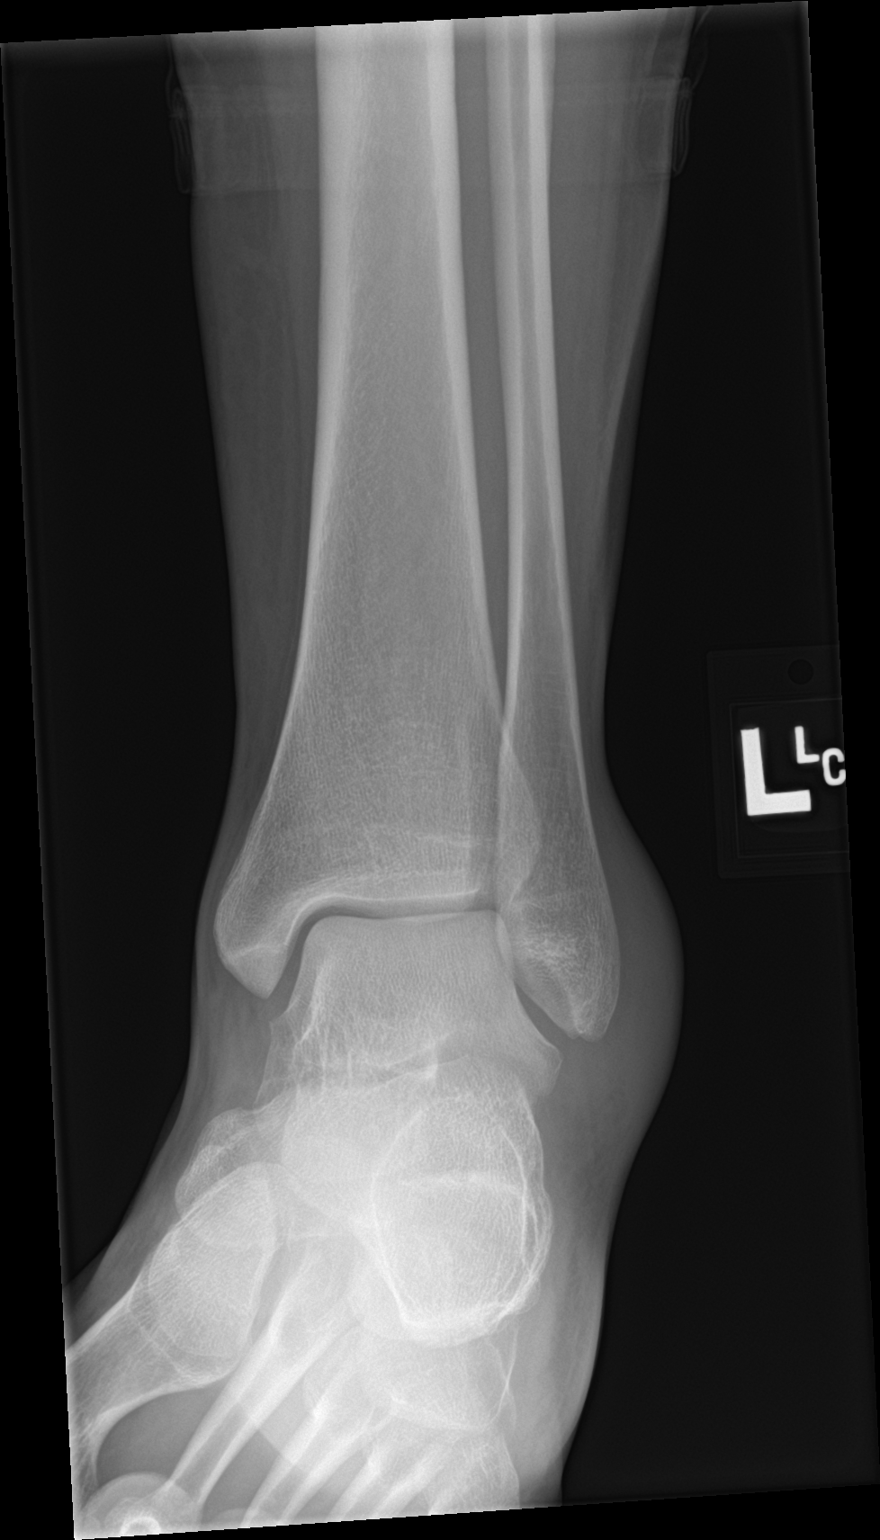

[ankle obl]
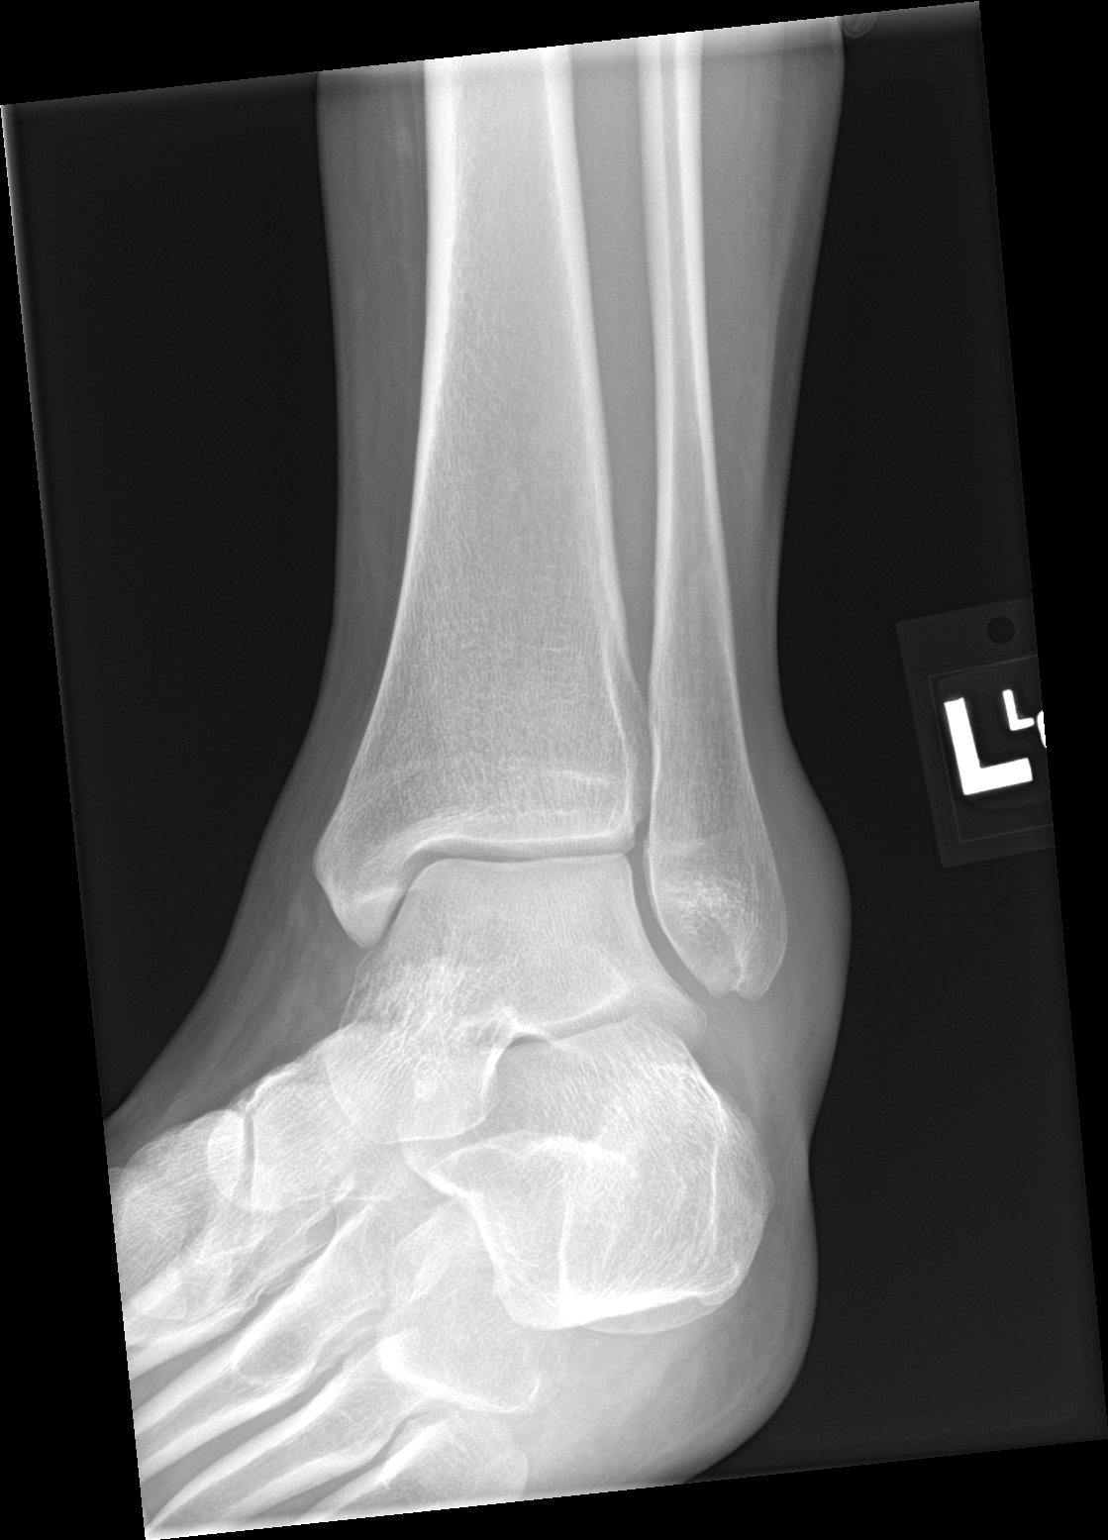

[ankle lat]
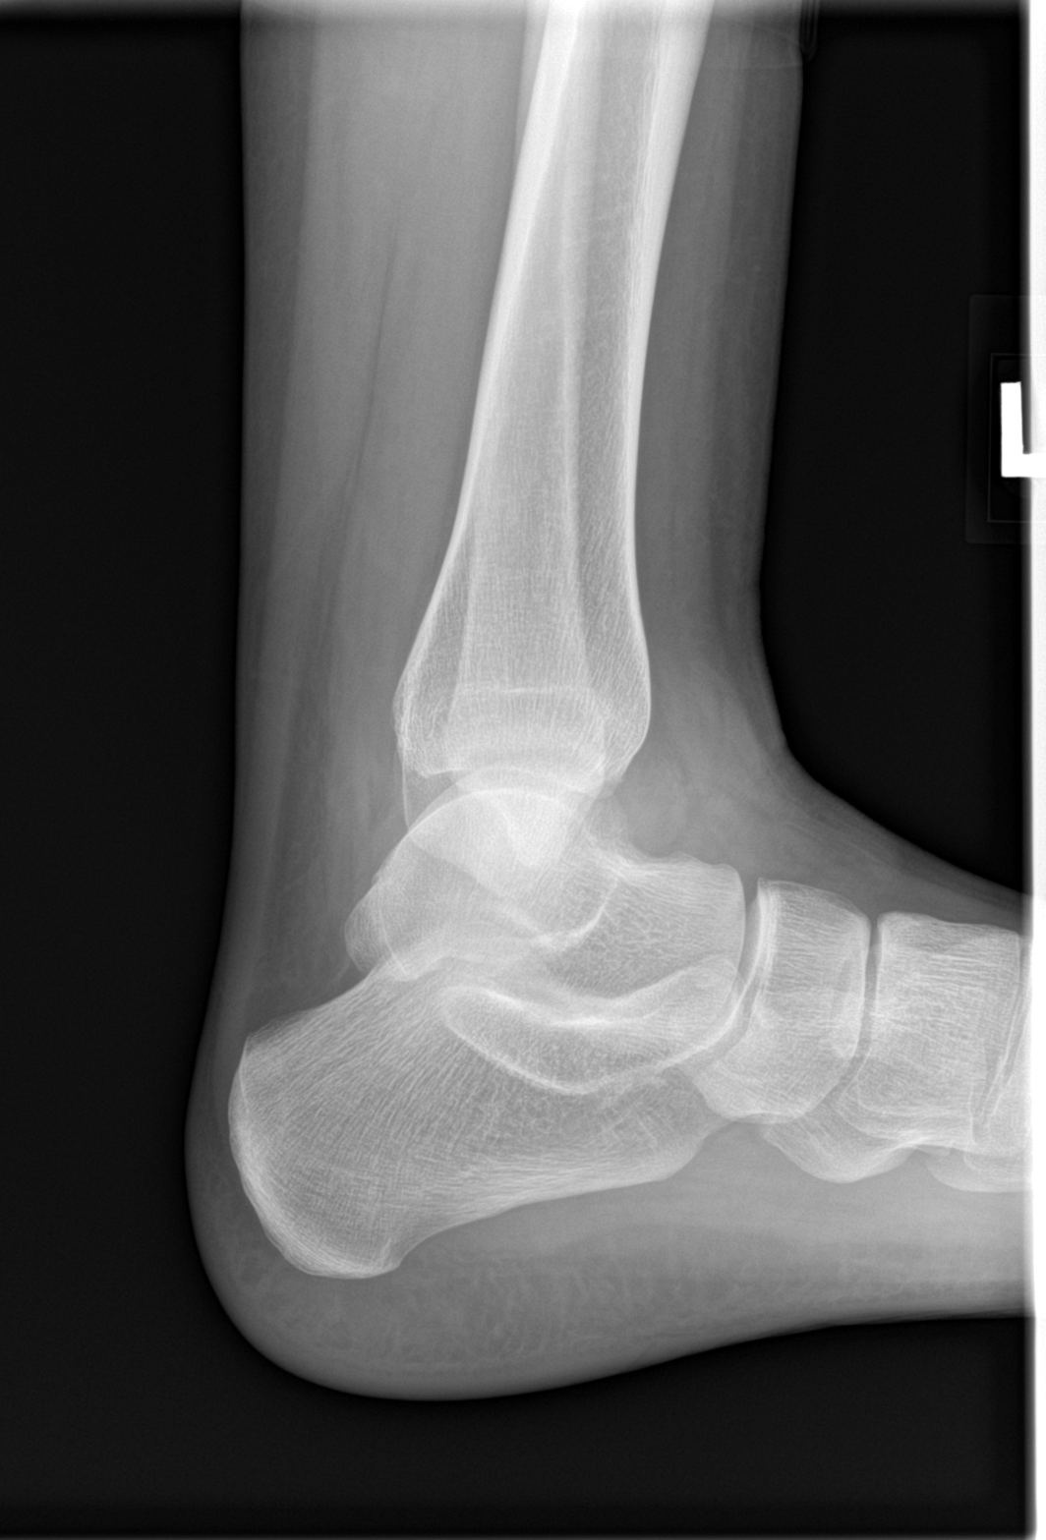

[3 of 3 positions shown; findings below may reference images not displayed]

FINDINGS: No fracture or dislocation is seen.

The ankle mortise is intact.

The base of the fifth metatarsal is unremarkable.

Mild lateral soft tissue swelling.
IMPRESSION: No fracture or dislocation is seen.

Mild lateral soft tissue swelling.

## 2022-11-02 ENCOUNTER — Other Ambulatory Visit: Payer: Self-pay

## 2022-11-02 ENCOUNTER — Emergency Department
Admission: EM | Admit: 2022-11-02 | Discharge: 2022-11-02 | Disposition: A | Discharge status: Home / Self Care | Payer: MEDICAID | Attending: Emergency Medicine | Admitting: Emergency Medicine

## 2022-11-02 ENCOUNTER — Emergency Department: Payer: MEDICAID

## 2022-11-02 DIAGNOSIS — R4182 Altered mental status, unspecified: Secondary | ICD-10-CM | POA: Diagnosis not present

## 2022-11-02 DIAGNOSIS — F840 Autistic disorder: Secondary | ICD-10-CM | POA: Insufficient documentation

## 2022-11-02 DIAGNOSIS — F4320 Adjustment disorder, unspecified: Secondary | ICD-10-CM

## 2022-11-02 DIAGNOSIS — F429 Obsessive-compulsive disorder, unspecified: Secondary | ICD-10-CM | POA: Insufficient documentation

## 2022-11-02 DIAGNOSIS — F4323 Adjustment disorder with mixed anxiety and depressed mood: Secondary | ICD-10-CM

## 2022-11-02 DIAGNOSIS — F909 Attention-deficit hyperactivity disorder, unspecified type: Secondary | ICD-10-CM | POA: Insufficient documentation

## 2022-11-02 LAB — URINE DRUG SCREEN, QUALITATIVE (ARMC ONLY)
Amphetamines, Ur Screen: NOT DETECTED
Barbiturates, Ur Screen: NOT DETECTED
Benzodiazepine, Ur Scrn: NOT DETECTED
Cannabinoid 50 Ng, Ur ~~LOC~~: NOT DETECTED
Cocaine Metabolite,Ur ~~LOC~~: NOT DETECTED
MDMA (Ecstasy)Ur Screen: NOT DETECTED
Methadone Scn, Ur: NOT DETECTED
Opiate, Ur Screen: NOT DETECTED
Phencyclidine (PCP) Ur S: NOT DETECTED
Tricyclic, Ur Screen: NOT DETECTED

## 2022-11-02 LAB — URINALYSIS, COMPLETE (UACMP) WITH MICROSCOPIC
Bilirubin Urine: NEGATIVE
Glucose, UA: NEGATIVE mg/dL
Hgb urine dipstick: NEGATIVE
Ketones, ur: NEGATIVE mg/dL
Leukocytes,Ua: NEGATIVE
Nitrite: POSITIVE — AB
Protein, ur: NEGATIVE mg/dL
Specific Gravity, Urine: 1.021 (ref 1.005–1.030)
pH: 6 (ref 5.0–8.0)

## 2022-11-02 LAB — COMPREHENSIVE METABOLIC PANEL
ALT: 11 U/L (ref 0–44)
AST: 15 U/L (ref 15–41)
Albumin: 4.3 g/dL (ref 3.5–5.0)
Alkaline Phosphatase: 63 U/L (ref 38–126)
Anion gap: 9 (ref 5–15)
BUN: 15 mg/dL (ref 6–20)
CO2: 25 mmol/L (ref 22–32)
Calcium: 9.3 mg/dL (ref 8.9–10.3)
Chloride: 105 mmol/L (ref 98–111)
Creatinine, Ser: 0.67 mg/dL (ref 0.44–1.00)
GFR, Estimated: >60 mL/min (ref >60)
Glucose, Bld: 112 mg/dL — ABNORMAL HIGH (ref 70–99)
Potassium: 3.7 mmol/L (ref 3.5–5.1)
Sodium: 139 mmol/L (ref 135–145)
Total Bilirubin: 0.7 mg/dL (ref 0.3–1.2)
Total Protein: 7.4 g/dL (ref 6.5–8.1)

## 2022-11-02 LAB — SALICYLATE LEVEL: Salicylate Lvl: <7 mg/dL — ABNORMAL LOW (ref 7.0–30.0)

## 2022-11-02 LAB — CBC
HCT: 40.7 % (ref 36.0–46.0)
Hemoglobin: 13.3 g/dL (ref 12.0–15.0)
MCH: 30.2 pg (ref 26.0–34.0)
MCHC: 32.7 g/dL (ref 30.0–36.0)
MCV: 92.5 fL (ref 80.0–100.0)
Platelets: 307 10*3/uL (ref 150–400)
RBC: 4.4 MIL/uL (ref 3.87–5.11)
RDW: 11.8 % (ref 11.5–15.5)
WBC: 4 10*3/uL (ref 4.0–10.5)
nRBC: 0 % (ref 0.0–0.2)

## 2022-11-02 LAB — POC URINE PREG, ED: Preg Test, Ur: NEGATIVE

## 2022-11-02 LAB — ACETAMINOPHEN LEVEL: Acetaminophen (Tylenol), Serum: <10 ug/mL — ABNORMAL LOW (ref 10–30)

## 2022-11-02 LAB — ETHANOL: Alcohol, Ethyl (B): <10 mg/dL (ref <10)

## 2022-11-02 NOTE — ED Provider Notes (Signed)
[facility] Provider Note    Event Date/Time   First MD Initiated Contact with Patient 11/02/22 1620     (approximate)  History   Chief Complaint: Mental Health Problem  HPI  Michelle Wong is a 32 y.o. female with a past medical history of autism, ADHD, OCD, presents to the emergency department for change in mental status.  According to the parents since yesterday the patient has been exhibiting some bizarre behaviors.  They have noted that the patient seems confused and is very slow in her responses.  They state normally the patient is quite independent at baseline and in fact they are looking at moving into an assisted apartment soon.  They deny any medication changes.  They spoke to their psychiatrist today who recommended that they come to the emergency department for further evaluation.  Denies any infectious symptoms such as fever cough congestion urinary symptoms.  Physical Exam   Triage Vital Signs: ED Triage Vitals  Encounter Vitals Group     BP 11/02/22 1530 (!) 120/105     Systolic BP Percentile --      Diastolic BP Percentile --      Pulse Rate 11/02/22 1530 97     Resp 11/02/22 1530 18     Temp 11/02/22 1530 98.7 F (37.1 C)     Temp Source 11/02/22 1530 Oral     SpO2 11/02/22 1530 97 %     Weight --      Height --      Head Circumference --      Peak Flow --      Pain Score 11/02/22 1531 0     Pain Loc --      Pain Education --      Exclude from Growth Chart --     Most recent vital signs: Vitals:   11/02/22 1530  BP: (!) 120/105  Pulse: 97  Resp: 18  Temp: 98.7 F (37.1 C)  SpO2: 97%    General: Awake, no distress.  Well-appearing. CV:  Good peripheral perfusion.  Regular rate and rhythm  Resp:  Normal effort.  Equal breath sounds bilaterally.  Abd:  No distention.  Soft, nontender.  No rebound or guarding.    ED Results / Procedures / Treatments   RADIOLOGY  I have reviewed and interpreted CT head images.  No  obvious bleed or significant abnormality seen on my evaluation.    MEDICATIONS ORDERED IN ED: Medications - No data to display   IMPRESSION / MDM / ASSESSMENT AND PLAN / ED COURSE  I reviewed the triage vital signs and the nursing notes.  Patient's presentation is most consistent with acute presentation with potential threat to life or bodily function.  Patient presents to the emergency department for change in her mental state since yesterday.  Parents state at baseline patient is very independent but since yesterday they have noticed more bizarre behaviors and is not acting herself.  They deny any infectious symptoms denies any medication changes.  Currently patient is calm cooperative, no distress.  Difficult for me to ascertain her mental state being baseline or not however the parents state it is not.  Will have psychiatry and TTS evaluate.  Does not meet IVC criteria no SI or HI does not appear to be a harm to herself or anybody else.  Patient's medical workup so far is nonrevealing including a reassuring CBC, reassuring chemistry negative ethanol salicylate and acetaminophen levels negative urine drug screen.  I have reviewed the CT images the head with no significant findings.  Patient's medical workup is reassuring reassuring CBC negative pregnancy test, reassuring urinalysis, reassuring chemistry negative acetaminophen salicylate alcohol levels, negative urine drug screen.  Reassuring CT scan of the head.  Psychiatry was seen and evaluated, they believe the patient could possibly be suffering from an adjustment disorder but they do not believe the patient requires admission or hospitalization.  They believe the patient could safely follow-up as an outpatient.  Given the patient's reassuring medical workup I believe this is a reasonable plan and the parents are agreeable as well.  Will discharge with outpatient follow-up.  FINAL CLINICAL IMPRESSION(S) / ED DIAGNOSES   Altered mental  state   Note:  This document was prepared using Dragon voice recognition software and may include unintentional dictation errors.   Minna Antis, MD 11/02/22 1859

## 2022-11-02 NOTE — ED Notes (Signed)
Patient and family provided with discharge instructions including importance of follow up appt as needed with stated understanding. Patient stable and ambulatory with steady even gait on dispo.

## 2022-11-02 NOTE — BH Assessment (Signed)
Comprehensive Clinical Assessment (CCA) Screening, Triage and Referral Note  11/02/2022 Michelle Wong [id number]  Chief Complaint:  Chief Complaint  Patient presents with   Mental Health Problem   Visit Diagnosis: Adjustment Disorder  Michelle Wong is a 32 year old female who presents to the ER due to changes in her behavior and mental state. Her outpatient psychiatric provider advised her parents to bring her to the ER. Parents report, the changes started yesterday (11/01/2022) evening and continue throughout today. When asking her questions, her responses are not related to what is asked. Prior to coming to the ER, her mother told her to put on a bra, and she lifted up her shirt and said she had one on, while her father was present. This is outside of her character and norm. She was told to put on her shoes, so they can leave. She went in her room and came out and said she had them on but she didn't. Several other things occurred that caused concerned. Most recent medication change was April 2024 and it was for anxiety. She was fearing she was going to die. However, the change caused her to improve and do well.  Parents shared, the only change they can think of is the patient is about to move into her own place, with a support person.  Patient Reported Information How did you hear about [place]? Family/Friend  What Is the Reason for Your Visit/Call Today? Patient brought to the ER due to changes in her behaviors and mental state.  How Long Has This Been Causing You Problems? 1 wk - 1 month  What Do You Feel Would Help You the Most Today? Treatment for Depression or other mood problem   Have You Recently Had Any Thoughts About Hurting Yourself? No  Are You Planning to Commit Suicide/Harm Yourself At This time? No   Have you Recently Had Thoughts About Hurting Someone Michelle Wong? No  Are You Planning to Harm Someone at This Time? No  Explanation: No data recorded  Have You Used Any Alcohol  or Drugs in the Past 24 Hours? No  How Long Ago Did You Use Drugs or Alcohol? No data recorded What Did You Use and How Much? No data recorded  Do You Currently Have a Therapist/Psychiatrist? Yes  Name of Therapist/Psychiatrist: Dr. Elvera Lennox. [place] [phone])  Have You Been Recently Discharged From Any Office Practice or Programs? No  Explanation of Discharge From Practice/Program: No data recorded   CCA Screening Triage Referral Assessment Type of Contact: Face-to-Face  Telemedicine Service Delivery:   Is this Initial or Reassessment?   Date Telepsych consult ordered in [facility]:    Time Telepsych consult ordered in [facility]:    Location of Assessment: [facility] ED  Provider Location: [facility] ED   Collateral Involvement: No data recorded  Does Patient Have a Court Appointed Legal Guardian? No data recorded Name and Contact of Legal Guardian: No data recorded If Minor and Not Living with Parent(s), Who has Custody? No data recorded Is CPS involved or ever been involved? Never  Is APS involved or ever been involved? Never   Patient Determined To Be At Risk for Harm To Self or Others Based on Review of Patient Reported Information or Presenting Complaint? No  Method: No data recorded Availability of Means: No data recorded Intent: No data recorded Notification Required: No data recorded Additional Information for Danger to Others Potential: No data recorded Additional Comments for Danger to Others Potential: No data recorded Are There Guns or  Other Weapons in Your Home? No data recorded Types of Guns/Weapons: No data recorded Are These Weapons Safely Secured?                            No data recorded Who Could Verify You Are Able To Have These Secured: No data recorded Do You Have any Outstanding Charges, Pending Court Dates, Parole/Probation? No data recorded Contacted To Inform of Risk of Harm To Self or Others: No data recorded  Does Patient Present under Involuntary Commitment?  No  County of Residence: [place]   Patient Currently Receiving the Following Services: Medication Management   Determination of Need: Emergent (2 hours)  Options For Referral: ED Visit  Discharge Disposition:    Lilyan Gilford MS, LCAS, [facility], [facility] Therapeutic Triage Specialist 11/02/2022 6:20 PM

## 2022-11-02 NOTE — ED Triage Notes (Addendum)
Pt comes with c/o mental evaluation. Pt is Autistic and on medications per parents. Parents stated pt has been having bizarre behavior since yesterday. Pt has conversation with psychiatry who also recommended pt to come to ED.  Pt feels anxious. Pt is calm and cooperative. No SI or HI.  Pt doctor not sure if her thyroid is off or electrolytes.   Family denies any hx of this and states pt is very independent and self sufficient.

## 2022-11-02 NOTE — Discharge Instructions (Signed)
As we discussed please follow-up with your psychiatrist for further evaluation.  Return to the emergency department for any symptoms presently concerning to yourselves.

## 2022-11-03 DIAGNOSIS — F4323 Adjustment disorder with mixed anxiety and depressed mood: Secondary | ICD-10-CM | POA: Insufficient documentation

## 2022-11-03 DIAGNOSIS — F429 Obsessive-compulsive disorder, unspecified: Secondary | ICD-10-CM | POA: Insufficient documentation

## 2022-11-03 DIAGNOSIS — F909 Attention-deficit hyperactivity disorder, unspecified type: Secondary | ICD-10-CM | POA: Insufficient documentation

## 2022-11-03 DIAGNOSIS — F840 Autistic disorder: Secondary | ICD-10-CM | POA: Insufficient documentation

## 2022-11-17 ENCOUNTER — Telehealth: Payer: Self-pay

## 2022-11-17 NOTE — Telephone Encounter (Signed)
Pt's mother Michelle Wong) LVM in triage line requesting cb about pt's recent behavioral changes/mental health troubles. Has known dx of ASD and is wanting to r/o if related to [facility] implant.

## 2022-11-21 NOTE — Telephone Encounter (Signed)
 [facility] w/ pt's mother Consuella Lose) and father Genevie Cheshire per DPR.  They report last year in the spring, pt started having fears and paranoia that she was going to meet her death. Reports at the time, she did have mentions about possible dreams of occurrence.  Seeing psychiatrist, taking meds (rexulti/zoloft) with occasional clonazepam prn. Stated after, starting/change in medications, pt had sort of balanced out and seemed to be more at peace.  However, starting last week, reports odd/strange behaviors from pt. Expressing sxs as if having a stroke. States with concerns from last year and recently, had taken pt to ER for complete check and all was well with those visits and they reported also notifying psych with most recent concerns and psych chalked sxs up to ASD characteristics.   Parents wondering if any of these could possibly be coming from hormonal implant or if that can be ruled out. (Also reported about an 8-9lb wt gain since last yrs visit with [place]). Pt has had implant replaced x2 since being seen in this practice.  They have an appt tomorrow with you but wanted to give you a heads up and possibly save some time/discussion.   Routing to provider for review and closing encounter.

## 2022-11-22 ENCOUNTER — Ambulatory Visit (INDEPENDENT_AMBULATORY_CARE_PROVIDER_SITE_OTHER): Payer: MEDICAID | Admitting: Obstetrics and Gynecology

## 2022-11-22 ENCOUNTER — Encounter: Payer: Self-pay | Admitting: Obstetrics and Gynecology

## 2022-11-22 VITALS — BP 98/62 | HR 72

## 2022-11-22 DIAGNOSIS — F411 Generalized anxiety disorder: Secondary | ICD-10-CM

## 2022-11-22 DIAGNOSIS — F845 Asperger's syndrome: Secondary | ICD-10-CM | POA: Diagnosis not present

## 2022-11-22 DIAGNOSIS — Z3046 Encounter for surveillance of implantable subdermal contraceptive: Secondary | ICD-10-CM | POA: Diagnosis not present

## 2022-11-22 DIAGNOSIS — Z7689 Persons encountering health services in other specified circumstances: Secondary | ICD-10-CM

## 2022-11-22 NOTE — Progress Notes (Signed)
Patient presents to establish care with a new provider. 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  She is high functioning and is planning on moving into her apartment soon to live independently.  She is working at [employer] and loves it there. She recently had an ER visit for overwhelming fear of dying. She has had 2 nexplanons placed and this one is due for removal in 2025.  Michelle Wong has the nexplanon to stop bleeding and her mom would like her to have it for the birth control.  Her mother just wanted to make sure the nexplanon did not contribute to the episode.  She has not had any changes in her medications or this experience before.  A/p establish care  Discussed she has not had complications with any episodes in the past so is unlikely with the nexplanon to cause that.  She is pleased with it otherwise. RTC for annual exams and any concerns   30 minutes spent on reviewing records, imaging,  and one on one patient time and counseling patient and documentation Dr. Karma Greaser

## 2022-11-26 ENCOUNTER — Ambulatory Visit
Admission: EM | Admit: 2022-11-26 | Discharge: 2022-11-26 | Disposition: A | Discharge status: Home / Self Care | Payer: MEDICAID | Attending: Emergency Medicine | Admitting: Emergency Medicine

## 2022-11-26 DIAGNOSIS — H6691 Otitis media, unspecified, right ear: Secondary | ICD-10-CM

## 2022-11-26 DIAGNOSIS — H9202 Otalgia, left ear: Secondary | ICD-10-CM

## 2022-11-26 DIAGNOSIS — H6123 Impacted cerumen, bilateral: Secondary | ICD-10-CM | POA: Diagnosis not present

## 2022-11-26 MED ORDER — AMOXICILLIN 875 MG PO TABS: 875.0000 mg | ORAL_TABLET | Freq: Two times a day (BID) | ORAL | 0 refills | Status: AC

## 2022-11-26 NOTE — ED Triage Notes (Addendum)
Patient to Urgent Care with mom, complaints of left sided ear pain. Denies any fevers.   Reports symptoms started one week ago. Has been flushing/ using debrox. Pain worse w/ chewing.

## 2022-11-26 NOTE — ED Provider Notes (Signed)
Renaldo Fiddler    CSN: [id number] Arrival date & time: 11/26/22  1416      History   Chief Complaint Chief Complaint  Patient presents with   Otalgia    HPI Michelle Wong is a 32 y.o. female.  Accompanied by her mother, patient presents with ear pain x 1 week.  No fever, ear drainage, sore throat, cough, shortness of breath, or other symptoms.  Treatment attempted with Debrox and ear flushing.  Her medical history includes autism.  The history is provided by a parent, the patient and medical records.    Past Medical History:  Diagnosis Date   ADHD    Anxiety    Autism    OCD (obsessive compulsive disorder)     Patient Active Problem List   Diagnosis Date Noted   Adjustment disorder with mixed anxiety and depressed mood 11/03/2022   ADHD    Autism    OCD (obsessive compulsive disorder)     History reviewed. No pertinent surgical history.  OB History     Gravida  0   Para  0   Term  0   Preterm  0   AB  0   Living  0      SAB  0   IAB  0   Ectopic  0   Multiple  0   Live Births  0            Home Medications    Prior to Admission medications   Medication Sig Start Date End Date Taking? Authorizing Provider  amoxicillin (AMOXIL) 875 MG tablet Take 1 tablet (875 mg total) by mouth 2 (two) times daily for 10 days. 11/26/22 12/06/22 Yes Mickie Bail, NP  clonazePAM (KLONOPIN) 2 MG tablet Take 1-2 mg by mouth 2 (two) times daily as needed. Takes 1 in the a.m. & 1/2 in the pm 12/04/21   [provider]  etonogestrel (NEXPLANON) 68 MG IMPL implant 1 each by Subdermal route once.    [provider]  Lidocaine-Hydrocortisone Ace 1-1 % CREA Apply 1 Application topically 2 (two) times daily. Patient not taking: Reported on 11/22/2022 12/16/21   Chrzanowski, Clearnce Hasten B, NP  REXULTI 2 MG TABS tablet Take 1-2 mg by mouth every morning. 10/13/22   [provider]  sertraline (ZOLOFT) 100 MG tablet Take 150 mg by mouth every  morning. 10/25/22   [provider]    Family History History reviewed. No pertinent family history.  Social History Social History   Tobacco Use   Smoking status: Never   Smokeless tobacco: Never  Vaping Use   Vaping status: Never Used  Substance Use Topics   Alcohol use: No   Drug use: Not Currently     Allergies   Patient has no known allergies.   Review of Systems Review of Systems  Constitutional:  Negative for chills and fever.  HENT:  Positive for ear pain. Negative for ear discharge and sore throat.   Respiratory:  Negative for cough and shortness of breath.      Physical Exam Triage Vital Signs ED Triage Vitals [11/26/22 1459]  Encounter Vitals Group     BP 124/77     Systolic BP Percentile      Diastolic BP Percentile      Pulse Rate 79     Resp 18     Temp 99 F (37.2 C)     Temp src      SpO2 97 %  Weight      Height      Head Circumference      Peak Flow      Pain Score      Pain Loc      Pain Education      Exclude from Growth Chart    No data found.  Updated Vital Signs BP 124/77   Pulse 79   Temp 99 F (37.2 C)   Resp 18   SpO2 97%   Visual Acuity Right Eye Distance:   Left Eye Distance:   Bilateral Distance:    Right Eye Near:   Left Eye Near:    Bilateral Near:     Physical Exam Vitals and nursing note reviewed.  Constitutional:      General: She is not in acute distress.    Appearance: She is well-developed.  HENT:     Right Ear: There is impacted cerumen.     Left Ear: There is impacted cerumen.     Ears:     Comments: After cerumen removal, left TM noted to be clear and right TM erythematous.      Nose: Nose normal.     Mouth/Throat:     Mouth: Mucous membranes are moist.     Pharynx: Oropharynx is clear.  Cardiovascular:     Rate and Rhythm: Normal rate and regular rhythm.     Heart sounds: Normal heart sounds.  Pulmonary:     Effort: Pulmonary effort is normal. No respiratory distress.      Breath sounds: Normal breath sounds.  Musculoskeletal:     Cervical back: Neck supple.  Skin:    General: Skin is warm and dry.  Neurological:     Mental Status: She is alert.      UC Treatments / Results  Labs (all labs ordered are listed, but only abnormal results are displayed) Labs Reviewed - No data to display  EKG   Radiology No results found.  Procedures Procedures (including critical care time)  Medications Ordered in UC Medications - No data to display  Initial Impression / Assessment and Plan / UC Course  I have reviewed the triage vital signs and the nursing notes.  Pertinent labs & imaging results that were available during my care of the patient were reviewed by me and considered in my medical decision making (see chart for details).   Bilateral cerumen impaction, left otalgia.  Cerumen removed via irrigation by RN.  After cerumen removal, left TM noted to be clear and right TM erythematous.  Treating with amoxicillin.  Education provided on earwax buildup and otitis media.  Instructed patient and her mother to follow-up with her PCP if her symptoms are not improving.  They agree to plan of care.    Final Clinical Impressions(s) / UC Diagnoses   Final diagnoses:  Bilateral impacted cerumen  Acute otalgia, left  Right otitis media, unspecified otitis media type     Discharge Instructions      Take the amoxicillin as directed.  Follow-up with your primary care provider if your symptoms are not improving.      ED Prescriptions     Medication Sig Dispense Auth. Provider   amoxicillin (AMOXIL) 875 MG tablet Take 1 tablet (875 mg total) by mouth 2 (two) times daily for 10 days. 20 tablet Mickie Bail, NP      PDMP not reviewed this encounter.   Mickie Bail, NP 11/26/22 1600

## 2022-11-26 NOTE — Discharge Instructions (Addendum)
Take the amoxicillin as directed.  Follow up with your primary care provider if your symptoms are not improving.   ° ° °

## 2022-12-07 NOTE — Telephone Encounter (Signed)
Pt was seen on 11/22/2022. Encounter closed.

## 2023-01-16 ENCOUNTER — Ambulatory Visit: Payer: Medicaid Other | Admitting: Obstetrics and Gynecology

## 2023-01-23 ENCOUNTER — Encounter: Payer: Self-pay | Admitting: Obstetrics and Gynecology

## 2023-01-23 ENCOUNTER — Ambulatory Visit (INDEPENDENT_AMBULATORY_CARE_PROVIDER_SITE_OTHER): Payer: No Typology Code available for payment source | Admitting: Obstetrics and Gynecology

## 2023-01-23 VITALS — BP 110/80 | HR 98 | Ht 63.25 in | Wt 132.0 lb

## 2023-01-23 DIAGNOSIS — Z01419 Encounter for gynecological examination (general) (routine) without abnormal findings: Secondary | ICD-10-CM | POA: Diagnosis not present

## 2023-01-24 NOTE — Progress Notes (Signed)
32 y.o. y.o. female here for annual exam. No LMP recorded. Patient has had an implant.    G0 Autism.  Presents accompanied by her mother, but not present for the exam.   RP:  Established patient presenting for annual gyn exam    HPI: Well on Nexplanon x 01/2021.  No menses or BTB.  No pelvic pain.  Normal vaginal secretions.  Currently abstinent.  Pap Neg 12/2020.  No h/o abnormal Pap.  Will repeat Pap every 3-5  years.  Urine and bowel movements normal.  Breasts normal.  Body mass index 18.63. Body mass index is 23.2 kg/m.     01/23/2023    1:32 PM  Depression screen PHQ 2/9  Decreased Interest 0  Down, Depressed, Hopeless 0  PHQ - 2 Score 0    Blood pressure 110/80, pulse 98, height 5' 3.25" (1.607 m), weight 132 lb (59.9 kg), SpO2 98%.     Component Value Date/Time   DIAGPAP  12/09/2020 0937    - Negative for intraepithelial lesion or malignancy (NILM)   ADEQPAP  12/09/2020 0937    Satisfactory for evaluation; transformation zone component PRESENT.    GYN HISTORY:    Component Value Date/Time   DIAGPAP  12/09/2020 0937    - Negative for intraepithelial lesion or malignancy (NILM)   ADEQPAP  12/09/2020 0937    Satisfactory for evaluation; transformation zone component PRESENT.    OB History  Gravida Para Term Preterm AB Living  0 0 0 0 0 0  SAB IAB Ectopic Multiple Live Births  0 0 0 0 0    Past Medical History:  Diagnosis Date   ADHD    Anxiety    Autism    OCD (obsessive compulsive disorder)     History reviewed. No pertinent surgical history.  Current Outpatient Medications on File Prior to Visit  Medication Sig Dispense Refill   clonazePAM (KLONOPIN) 2 MG tablet Take 1-2 mg by mouth 2 (two) times daily as needed. Takes 1 in the a.m. & 1/2 in the pm     etonogestrel (NEXPLANON) 68 MG IMPL implant 1 each by Subdermal route once.     REXULTI 2 MG TABS tablet Take 1-2 mg by mouth every morning.     sertraline (ZOLOFT) 100 MG tablet Take 150 mg by  mouth every morning.     No current facility-administered medications on file prior to visit.    Social History   Socioeconomic History   Marital status: Single    Spouse name: Not on file   Number of children: Not on file   Years of education: Not on file   Highest education level: Not on file  Occupational History   Not on file  Tobacco Use   Smoking status: Never   Smokeless tobacco: Never  Vaping Use   Vaping status: Never Used  Substance and Sexual Activity   Alcohol use: No   Drug use: Not Currently   Sexual activity: Not Currently    Birth control/protection: Implant  Other Topics Concern   Not on file  Social History Narrative   Not on file   Social Determinants of Health   Financial Resource Strain: Not on file  Food Insecurity: Not on file  Transportation Needs: Not on file  Physical Activity: Not on file  Stress: Not on file  Social Connections: Not on file  Intimate Partner Violence: Not on file    History reviewed. No pertinent family history.   No Known  Allergies    Patient's last menstrual period was No LMP recorded. Patient has had an implant..            Review of Systems Alls systems reviewed and are negative.     Physical Exam Constitutional:      Appearance: Normal appearance.  Genitourinary:     Vulva and urethral meatus normal.     No lesions in the vagina.     Right Labia: No rash, lesions or skin changes.    Left Labia: No lesions, skin changes or rash.    No vaginal discharge or tenderness.     No vaginal prolapse present.    No vaginal atrophy present.     Right Adnexa: not tender, not palpable and no mass present.    Left Adnexa: not tender, not palpable and no mass present.    No cervical motion tenderness or discharge.     Uterus is not enlarged, tender or irregular.  Breasts:    Right: Normal.     Left: Normal.  HENT:     Head: Normocephalic.  Neck:     Thyroid: No thyroid mass, thyromegaly or thyroid  tenderness.  Cardiovascular:     Rate and Rhythm: Normal rate and regular rhythm.     Heart sounds: Normal heart sounds, S1 normal and S2 normal.  Pulmonary:     Effort: Pulmonary effort is normal.     Breath sounds: Normal breath sounds and air entry.  Abdominal:     General: There is no distension.     Palpations: Abdomen is soft. There is no mass.     Tenderness: There is no abdominal tenderness. There is no guarding or rebound.  Musculoskeletal:        General: Normal range of motion.     Cervical back: Full passive range of motion without pain, normal range of motion and neck supple. No tenderness.     Right lower leg: No edema.     Left lower leg: No edema.  Neurological:     Mental Status: She is alert.  Skin:    General: Skin is warm.  Psychiatric:        Mood and Affect: Mood normal.        Behavior: Behavior normal.        Thought Content: Thought content normal.  Vitals and nursing note reviewed. Exam conducted with a chaperone present.       A:         Well Woman GYN exam                             P:        Pap smear not indicated Encouraged annual mammogram screening  Labs and immunizations to do with PMD Discussed breast self exams Encouraged healthy lifestyle practices Encouraged Vit D and Calcium   No follow-ups on file.  Earley Favor

## 2023-01-29 ENCOUNTER — Encounter: Payer: Self-pay | Admitting: Primary Care

## 2023-01-29 ENCOUNTER — Ambulatory Visit (INDEPENDENT_AMBULATORY_CARE_PROVIDER_SITE_OTHER): Payer: No Typology Code available for payment source | Admitting: Primary Care

## 2023-01-29 VITALS — BP 118/76 | HR 64 | Temp 97.0°F | Ht 63.25 in | Wt 136.0 lb

## 2023-01-29 DIAGNOSIS — T50905A Adverse effect of unspecified drugs, medicaments and biological substances, initial encounter: Secondary | ICD-10-CM | POA: Diagnosis not present

## 2023-01-29 DIAGNOSIS — R635 Abnormal weight gain: Secondary | ICD-10-CM | POA: Diagnosis not present

## 2023-01-29 DIAGNOSIS — F4323 Adjustment disorder with mixed anxiety and depressed mood: Secondary | ICD-10-CM | POA: Diagnosis not present

## 2023-01-29 DIAGNOSIS — F429 Obsessive-compulsive disorder, unspecified: Secondary | ICD-10-CM | POA: Diagnosis not present

## 2023-01-29 DIAGNOSIS — F840 Autistic disorder: Secondary | ICD-10-CM | POA: Diagnosis not present

## 2023-01-29 DIAGNOSIS — F909 Attention-deficit hyperactivity disorder, unspecified type: Secondary | ICD-10-CM

## 2023-01-29 NOTE — Progress Notes (Signed)
Subjective:    Patient ID: Michelle Wong, female    DOB: 07-Jan-1991, 32 y.o.   MRN: [id number]  HPI  Michelle Wong is a very pleasant 32 y.o. female with a history of ADHD, OCD, anxiety and depression, autism who presents today who presents today to establish care and discuss the problems mentioned below. Will obtain/review records.  Her mother joins [place] today who helps to provide information for HPI.   1) ADHD/OCD/Anxiety and Depression: Currently managed on sertraline 150 mg daily, Rexulti 2-4 mg daily, clonazepam 1-2 mg PRN.   Following with Dr. Allena Katz, psychiatry, follows annually. Feels well managed on this regimen. Historically, her psychiatrist was unable to prescribe Rexulti as he was not a "medicaid doctor", so her PCP was prescribing Rexulti. Now she has additional insurance. Previously managed on Latuda, but this caused her to experience increased panic and a sense of doom.   She lives on her own in an apartment, had an assigned person through [facility] who was with her for several hours during the day, but this is no longer the case. She is living alone, does well on her own.   2) Weight Gain: Weight gain of 30 pounds since October 2023. Most of her weight gain began shortly after she began Rexulti. Rexulti has caused an increase in food cravings and her mother has noticed she's eating much more than before.   Her mother is concerned about her weight gain. Is fearful of the patient developing medical disease. She enjoys eating ice cream, cookies, chips, snacks. Her mother has tried to correct her eating habits but has not been successful. Her mother questions today if her daughter's weight can be lost. Her mother has a history of binge eating disorder which is controlled. Her maternal grandmother also had an eating disorder.     Wt Readings from Last 3 Encounters:  01/29/23 136 lb (61.7 kg)  01/23/23 132 lb (59.9 kg)  12/14/21 106 lb (48.1 kg)     BP Readings from Last 3  Encounters:  01/29/23 118/76  01/23/23 110/80  11/26/22 124/77      Review of Systems  Respiratory:  Negative for shortness of breath.   Cardiovascular:  Negative for chest pain.  Gastrointestinal:  Negative for constipation and diarrhea.  Neurological:  Negative for headaches.  Psychiatric/Behavioral:  The patient is nervous/anxious.        See HPI         Past Medical History:  Diagnosis Date   ADHD    Anxiety    Autism    OCD (obsessive compulsive disorder)     Social History   Socioeconomic History   Marital status: Single    Spouse name: Not on file   Number of children: Not on file   Years of education: Not on file   Highest education level: Not on file  Occupational History   Not on file  Tobacco Use   Smoking status: Never   Smokeless tobacco: Never  Vaping Use   Vaping status: Never Used  Substance and Sexual Activity   Alcohol use: No   Drug use: Not Currently   Sexual activity: Not Currently    Birth control/protection: Implant  Other Topics Concern   Not on file  Social History Narrative   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    Enjoys watching movies.    Social Determinants of Health   Financial Resource Strain: Not on file  Food Insecurity: Not on file  Transportation Needs: Not on file  Physical Activity: Not on file  Stress: Not on file  Social Connections: Not on file  Intimate Partner Violence: Not on file    History reviewed. No pertinent surgical history.  Family History  Problem Relation Age of Onset   Hypertension Father    Diabetes Mellitus II Father    Hyperlipidemia Father    Bipolar disorder Maternal Grandmother     No Known Allergies  Current Outpatient Medications on File Prior to Visit  Medication Sig Dispense Refill   clonazePAM (KLONOPIN) 2 MG tablet Take 1-2 mg by mouth 2 (two) times daily as needed. Takes 1 in the a.m. & 1/2 in the pm     etonogestrel (NEXPLANON) 68 MG IMPL implant 1 each by Subdermal route  once.     REXULTI 2 MG TABS tablet Take 1-2 mg by mouth every morning.     sertraline (ZOLOFT) 100 MG tablet Take 150 mg by mouth every morning.     No current facility-administered medications on file prior to visit.    BP 118/76   Pulse 64   Temp (!) 97 F (36.1 C) (Temporal)   Ht 5' 3.25" (1.607 m)   Wt 136 lb (61.7 kg)   SpO2 98%   BMI 23.90 kg/m  Objective:   Physical Exam Cardiovascular:     Rate and Rhythm: Normal rate and regular rhythm.  Pulmonary:     Effort: Pulmonary effort is normal.     Breath sounds: Normal breath sounds.  Abdominal:     General: Bowel sounds are normal.     Palpations: Abdomen is soft.     Tenderness: There is no abdominal tenderness.  Musculoskeletal:     Cervical back: Neck supple.  Skin:    General: Skin is warm and dry.  Neurological:     Mental Status: She is alert and oriented to person, place, and time.  Psychiatric:        Mood and Affect: Mood normal.           Assessment & Plan:  Obsessive-compulsive disorder, unspecified type Assessment & Plan: Improved.  Following with psychiatry.  Discussed that Rexulti is not something I prescribe and to have psychiatry resume prescribing.  Continue Rexulti 1-2 mg daily, sertraline 150 mg daily, clonazepam PRN.    Autism Assessment & Plan: Stable.  Following with psychiatry,    Adjustment disorder with mixed anxiety and depressed mood Assessment & Plan: Controlled.  Continue sertraline 150 mg daily. Following with psychiatry.    Attention deficit hyperactivity disorder (ADHD), unspecified ADHD type Assessment & Plan: Stable. Following with psychiatry.    Weight gain due to medication Assessment & Plan: Secondary to Rexulti.  Discussed with both patient and mother that Rexulti will increase food cravings which can lead to increased food intake and weight gain. Discussed to work on limiting snacks, ice cream, junk food, etc. Increase vegetables, fruit, lean  protein.   Follow up in 2 months.  Consider lab work at that point.         [name], NP

## 2023-01-29 NOTE — Assessment & Plan Note (Signed)
Improved.  Following with psychiatry.  Discussed that Rexulti is not something I prescribe and to have psychiatry resume prescribing.  Continue Rexulti 1-2 mg daily, sertraline 150 mg daily, clonazepam PRN.

## 2023-01-29 NOTE — Assessment & Plan Note (Signed)
Controlled.  Continue sertraline 150 mg daily. Following with psychiatry.

## 2023-01-29 NOTE — Assessment & Plan Note (Signed)
Stable. Following with psychiatry.

## 2023-01-29 NOTE — Assessment & Plan Note (Signed)
Stable.  Following with psychiatry,

## 2023-01-29 NOTE — Patient Instructions (Signed)
Schedule a follow up visit for 2 months for health check.   It was a pleasure to meet you today! Please don't hesitate to contact me with any questions. Welcome to [facility]!

## 2023-01-29 NOTE — Assessment & Plan Note (Signed)
Secondary to Rexulti.  Discussed with both patient and mother that Rexulti will increase food cravings which can lead to increased food intake and weight gain. Discussed to work on limiting snacks, ice cream, junk food, etc. Increase vegetables, fruit, lean protein.   Follow up in 2 months.  Consider lab work at that point.

## 2023-04-03 ENCOUNTER — Ambulatory Visit: Payer: No Typology Code available for payment source | Admitting: Primary Care

## 2023-08-21 ENCOUNTER — Telehealth: Payer: Self-pay | Admitting: *Deleted

## 2023-08-21 DIAGNOSIS — Z3046 Encounter for surveillance of implantable subdermal contraceptive: Secondary | ICD-10-CM

## 2023-08-21 NOTE — Telephone Encounter (Signed)
 Spoke with patients mother Lewie Rector, ok per dpr.   Mom requesting to schedule Nexplanon  exchange for 01/2024. Nexplanon  placed 01/2021.   Last AEX 01/23/2023  Scheduled AEX for 11/20 at 1430 and Nexplanon  exchange on 11/3 at 1430 with Dr. Tia Flowers. Mom verbalizes understanding and is agreeable.   Routing to provider for final review. Patient is agreeable to disposition. Will close encounter.  Orders placed.

## 2023-10-10 ENCOUNTER — Telehealth: Payer: Self-pay | Admitting: Primary Care

## 2023-10-10 NOTE — Telephone Encounter (Signed)
 LVM to have patient call and schedule CPE  Copied from CRM #[id number]. Topic: Appointments - Scheduling Inquiry for Clinic >> Oct 10, 2023  9:26 AM Burnard DEL wrote: Reason for CRM: Patient would like to be scheduled for her annual physical with PCP .Epic is not allowing scheduling.

## 2023-12-10 ENCOUNTER — Telehealth: Payer: Self-pay | Admitting: *Deleted

## 2023-12-10 NOTE — Telephone Encounter (Signed)
 Not a pt here at [facility].

## 2023-12-10 NOTE — Telephone Encounter (Signed)
 Copied from CRM [id number]. Topic: General - Other >> Dec 07, 2023  4:07 PM Miquel SAILOR wrote: Reason for CRM:  Dianna from [facility] health/([phone]-Looking on faxing PCP on [place] for Hep B vaccine but PT is not with us . No further questions

## 2024-01-07 ENCOUNTER — Ambulatory Visit: Payer: Self-pay | Admitting: Obstetrics and Gynecology

## 2024-01-07 ENCOUNTER — Other Ambulatory Visit: Payer: Self-pay | Admitting: Obstetrics and Gynecology

## 2024-01-07 ENCOUNTER — Ambulatory Visit: Admitting: Obstetrics and Gynecology

## 2024-01-07 ENCOUNTER — Encounter: Payer: Self-pay | Admitting: Obstetrics and Gynecology

## 2024-01-07 VITALS — BP 118/80 | HR 75 | Ht 63.5 in | Wt 143.0 lb

## 2024-01-07 DIAGNOSIS — Z23 Encounter for immunization: Secondary | ICD-10-CM

## 2024-01-07 DIAGNOSIS — Z3046 Encounter for surveillance of implantable subdermal contraceptive: Secondary | ICD-10-CM | POA: Diagnosis not present

## 2024-01-07 DIAGNOSIS — Z01812 Encounter for preprocedural laboratory examination: Secondary | ICD-10-CM

## 2024-01-07 LAB — PREGNANCY, URINE: Preg Test, Ur: NEGATIVE

## 2024-01-07 MED ORDER — ETONOGESTREL 68 MG ~~LOC~~ IMPL
68.0000 mg | DRUG_IMPLANT | Freq: Once | SUBCUTANEOUS | Status: AC
Start: 2024-01-07 — End: 2024-01-07
  Administered 2024-01-07: 68 mg via SUBCUTANEOUS

## 2024-01-07 NOTE — Progress Notes (Unsigned)
 Patient presents for nexplanon  removal and insertion of new nexplanon . Patient also counseled on gardesil and wanted to have the vaccination. She is pleased with the nexplanon  TIME OUT PERFORMED  Blood pressure 118/80, pulse 75, height 5' 3.5 (1.613 m), weight 143 lb (64.9 kg), SpO2 99%. Procedure reviewed and all questions answered Nexplanon  Removal  Patient identified, informed consent performed, consent signed.  Nexplanon  site identified in left arm.  Area prepped in usual sterile fashon. One ml of 1% lidocaine  was used to anesthetize the area at the distal end of the implant. A small stab incision was made right beside the implant on the distal portion. The Nexplanon  rod was grasped using hemostats and removed without difficulty. There was minimal blood loss. There were no complications. Patient insertion site covered with gauze and a pressure bandage to reduce any bruising.   Nexplanon  Insertion Procedure Patient identified, informed consent performed, consent signed.   Patient does understand that irregular bleeding is a very common side effect of this medication. She was advised to have backup contraception for one week after placement. Pregnancy test in clinic today was negative.  Appropriate time out taken.  Patient's left arm was prepped and draped in the usual sterile fashion. The area of insertion was noted on the left arm.  Patient was prepped with alcohol swab and then injected with 3 ml of 1% lidocaine .  She was prepped with betadine, Nexplanon  removed from packaging,  Device confirmed in needle, then inserted full length of needle and withdrawn per handbook instructions. Nexplanon  was able to palpated in the patient's arm; patient  palpated the insert herself. There was minimal blood loss.  Patient insertion site covered with guaze and a pressure bandage to reduce any bruising.  The patient tolerated the procedure well and was given post procedure instructions.   A/p nexplanon  removal  and insertion, gardesil vaccination Gardesil counseling done and desires vaccination #1 of 3 given today Counseled on post care instructions and when to return Patient to return for annual exam at the end of the sooner, sooner with any concerns Dr. Glennon

## 2024-01-07 NOTE — Progress Notes (Unsigned)
 PROCEDURE NOTE: Nexplanon (etonogestrel implant) Removal    Michelle Wong is a 33 year old [id number] who requests Nexplanon removal. The Nexplanon was placed in 2022 in their left arm.    Patient is clear about their decision for removal, and understands fertility may immediately return upon removal.    Indication:  patient request   Allergies:  Patient has no known allergies.  Allergy to anesthetics, latex, chlorhexidine: No    Correct Patient using  2 PATIENT IDENTIFIERS YES   Correct SIDE marked YES   Correct SITE marked YES   Correct PROCEDURE YES   Correct EQUIPMENT and EQUIPMENT SETTINGS YES   Completed CONSENT YES, Date: 08/07/2023             The patient was placed in supine position with their left arm flexed at the elbow and externally rotated, with their hand next their head. The current rod was palpated subcutaneously in the inner side of the upper arm.  The area was prepared with Betadine. Lidocaine 1% with epinephrine  total 1 to 2 mL, was injected just under the distal tip. A 2-3 mm incision was made over the distal tip with a #11 scalpel longitudinally along the axis of the implant while downward pressure was placed on the proximal end. Then limited dissection was needed to remove the rod from its surrounding fibrous tissue. The rod was examined and is intact. Steri-strips were applied. A pressure bandage was applied.   The patient tolerated the procedure well  Complications: none   Procedure performed by Clinician   Nexplanon was removed from patient's medication list: Yes   They were instructed to keep the area clean and dry, to remove the pressure bandage in 24 hours.   Discussed that fertility can resume immediately, and an alternative contraception method is needed if desired.

## 2024-01-08 ENCOUNTER — Encounter: Payer: Self-pay | Admitting: Obstetrics and Gynecology

## 2024-01-15 ENCOUNTER — Encounter: Payer: Self-pay | Admitting: Primary Care

## 2024-01-15 ENCOUNTER — Ambulatory Visit: Admitting: Primary Care

## 2024-01-15 VITALS — BP 114/78 | HR 74 | Temp 98.1°F | Ht 63.5 in | Wt 152.0 lb

## 2024-01-15 DIAGNOSIS — F429 Obsessive-compulsive disorder, unspecified: Secondary | ICD-10-CM

## 2024-01-15 DIAGNOSIS — F909 Attention-deficit hyperactivity disorder, unspecified type: Secondary | ICD-10-CM

## 2024-01-15 DIAGNOSIS — Z0000 Encounter for general adult medical examination without abnormal findings: Secondary | ICD-10-CM | POA: Insufficient documentation

## 2024-01-15 DIAGNOSIS — R739 Hyperglycemia, unspecified: Secondary | ICD-10-CM

## 2024-01-15 DIAGNOSIS — F4323 Adjustment disorder with mixed anxiety and depressed mood: Secondary | ICD-10-CM | POA: Diagnosis not present

## 2024-01-15 NOTE — Assessment & Plan Note (Signed)
Stable. Following with psychiatry.

## 2024-01-15 NOTE — Assessment & Plan Note (Signed)
 Discussed Tetanus vaccine.  Pap smear UTD. Follow with GYN  Discussed the importance of a healthy diet and regular exercise in order for weight loss, and to reduce the risk of further co-morbidity.  Exam stable. Labs pending.  Follow up in 1 year for repeat physical.

## 2024-01-15 NOTE — Patient Instructions (Signed)
 Schedule a lab only appointment to return fasting.  It was a pleasure to see you today!

## 2024-01-15 NOTE — Assessment & Plan Note (Signed)
 Recently deteriorated.   Following with psychiatry. Continue Rexulti 3 mg daily which was recently increased.   Continue Zoloft 100 mg daily.

## 2024-01-15 NOTE — Progress Notes (Signed)
 Subjective:    Patient ID: Michelle Wong, female    DOB: 1990-12-19, 33 y.o.   MRN: [id number]  Michelle Wong is a very pleasant 33 y.o. female who presents today for complete physical and follow up of chronic conditions.  Her mother joins us  today. Her Rexulti was recently increased per psychiatry due to auditory hallucinations. She's doing well overall. Plans to start at the [facility] in early 2026. She's active during the day. Lives alone. Limiting fast food and junk food.   Immunizations: -Tetanus: Unsure. -Influenza: Completed this season  -HPV: Completed 1 dose on 01/07/24  Diet: Fair diet.  Exercise: Walking.  Eye exam: No issues. Dental exam: Completes semi-annually    Pap Smear: Completed in 2022 per GYN.  Wt Readings from Last 3 Encounters:  01/15/24 152 lb (68.9 kg)  01/07/24 143 lb (64.9 kg)  01/29/23 136 lb (61.7 kg)      Review of Systems  Constitutional:  Negative for unexpected weight change.  HENT:  Negative for rhinorrhea.   Respiratory:  Negative for cough and shortness of breath.   Cardiovascular:  Negative for chest pain.  Gastrointestinal:  Negative for constipation and diarrhea.  Genitourinary:  Negative for difficulty urinating.  Musculoskeletal:  Negative for arthralgias and myalgias.  Skin:  Negative for rash.  Allergic/Immunologic: Negative for environmental allergies.  Neurological:  Negative for dizziness and headaches.  Psychiatric/Behavioral:  The patient is nervous/anxious.          Past Medical History:  Diagnosis Date   ADHD    Anxiety    Autism    Nexplanon  in place    01/07/24 placed in left arm   OCD (obsessive compulsive disorder)     Social History   Socioeconomic History   Marital status: Single    Spouse name: Not on file   Number of children: Not on file   Years of education: Not on file   Highest education level: Not on file  Occupational History   Not on file  Tobacco Use   Smoking status: Never   Smokeless  tobacco: Never  Vaping Use   Vaping status: Never Used  Substance and Sexual Activity   Alcohol use: No   Drug use: Not Currently   Sexual activity: Not Currently    Birth control/protection: Implant  Other Topics Concern   Not on file  Social History Narrative   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    Enjoys watching movies.    Social Drivers of Corporate Investment Banker Strain: Not on file  Food Insecurity: Not on file  Transportation Needs: Not on file  Physical Activity: Not on file  Stress: Not on file  Social Connections: Not on file  Intimate Partner Violence: Not on file    History reviewed. No pertinent surgical history.  Family History  Problem Relation Age of Onset   Hypertension Father    Diabetes Mellitus II Father    Hyperlipidemia Father    Bipolar disorder Maternal Grandmother     No Known Allergies  Current Outpatient Medications on File Prior to Visit  Medication Sig Dispense Refill   clonazePAM (KLONOPIN) 2 MG tablet Take 1-2 mg by mouth 2 (two) times daily as needed. Takes 1 in the a.m. & 1/2 in the pm     etonogestrel  (NEXPLANON ) 68 MG IMPL implant 1 each by Subdermal route once.     REXULTI 2 MG TABS tablet Take 1-2 mg by mouth every morning. (Patient  taking differently: Take 1-2 mg by mouth every morning. Takes 3 mg tablet every morning)     sertraline (ZOLOFT) 100 MG tablet Take 150 mg by mouth every morning.     No current facility-administered medications on file prior to visit.    BP 114/78   Pulse 74   Temp 98.1 F (36.7 C) (Oral)   Ht 5' 3.5 (1.613 m)   Wt 152 lb (68.9 kg)   SpO2 98%   BMI 26.50 kg/m  Objective:   Physical Exam HENT:     Right Ear: Tympanic membrane and ear canal normal.     Left Ear: Tympanic membrane and ear canal normal.  Eyes:     Pupils: Pupils are equal, round, and reactive to light.  Cardiovascular:     Rate and Rhythm: Normal rate and regular rhythm.  Pulmonary:     Effort: Pulmonary effort is  normal.     Breath sounds: Normal breath sounds.  Abdominal:     General: Bowel sounds are normal.     Palpations: Abdomen is soft.     Tenderness: There is no abdominal tenderness.  Musculoskeletal:        General: Normal range of motion.     Cervical back: Neck supple.  Skin:    General: Skin is warm and dry.  Neurological:     Mental Status: She is alert and oriented to person, place, and time.     Cranial Nerves: No cranial nerve deficit.     Deep Tendon Reflexes:     Reflex Scores:      Patellar reflexes are 2+ on the right side and 2+ on the left side. Psychiatric:        Mood and Affect: Mood normal.     Physical Exam        Assessment & Plan:  Preventative health care Assessment & Plan: Discussed Tetanus vaccine.  Pap smear UTD. Follow with GYN  Discussed the importance of a healthy diet and regular exercise in order for weight loss, and to reduce the risk of further co-morbidity.  Exam stable. Labs pending.  Follow up in 1 year for repeat physical.   Orders: -     Comprehensive metabolic panel with GFR; Future -     Lipid panel; Future -     Hemoglobin A1c; Future -     CBC; Future  Obsessive-compulsive disorder, unspecified type Assessment & Plan: Recently deteriorated.   Following with psychiatry. Continue Rexulti 3 mg daily which was recently increased.   Continue Zoloft 100 mg daily.   Adjustment disorder with mixed anxiety and depressed mood Assessment & Plan: Recently deteriorated.   Following with psychiatry. Continue Rexulti 3 mg daily which was recently increased.   Continue Zoloft 100 mg daily.   Attention deficit hyperactivity disorder (ADHD), unspecified ADHD type Assessment & Plan: Stable.  Following with psychiatry.    Hyperglycemia -     Comprehensive metabolic panel with GFR; Future -     Lipid panel; Future -     Hemoglobin A1c; Future    Assessment and Plan Assessment & Plan         Comer MARLA Gaskins, NP     History of Present Illness

## 2024-01-16 ENCOUNTER — Other Ambulatory Visit

## 2024-01-16 ENCOUNTER — Ambulatory Visit: Payer: Self-pay | Admitting: Primary Care

## 2024-01-16 DIAGNOSIS — Z0000 Encounter for general adult medical examination without abnormal findings: Secondary | ICD-10-CM | POA: Diagnosis not present

## 2024-01-16 DIAGNOSIS — R739 Hyperglycemia, unspecified: Secondary | ICD-10-CM

## 2024-01-16 LAB — COMPREHENSIVE METABOLIC PANEL WITH GFR
ALT: 10 U/L (ref 0–35)
AST: 13 U/L (ref 0–37)
Albumin: 4.7 g/dL (ref 3.5–5.2)
Alkaline Phosphatase: 73 U/L (ref 39–117)
BUN: 10 mg/dL (ref 6–23)
CO2: 28 meq/L (ref 19–32)
Calcium: 10 mg/dL (ref 8.4–10.5)
Chloride: 101 meq/L (ref 96–112)
Creatinine, Ser: 0.75 mg/dL (ref 0.40–1.20)
GFR: 104.03 mL/min (ref >60.00)
Glucose, Bld: 86 mg/dL (ref 70–99)
Potassium: 4.3 meq/L (ref 3.5–5.1)
Sodium: 137 meq/L (ref 135–145)
Total Bilirubin: 0.8 mg/dL (ref 0.2–1.2)
Total Protein: 7.5 g/dL (ref 6.0–8.3)

## 2024-01-16 LAB — CBC
HCT: 41.8 % (ref 36.0–46.0)
Hemoglobin: 14.4 g/dL (ref 12.0–15.0)
MCHC: 34.3 g/dL (ref 30.0–36.0)
MCV: 87.8 fl (ref 78.0–100.0)
Platelets: 362 K/uL (ref 150.0–400.0)
RBC: 4.76 Mil/uL (ref 3.87–5.11)
RDW: 13.1 % (ref 11.5–15.5)
WBC: 4.5 K/uL (ref 4.0–10.5)

## 2024-01-16 LAB — LIPID PANEL
Cholesterol: 182 mg/dL (ref 0–200)
HDL: 58.2 mg/dL (ref >39.00)
LDL Cholesterol: 113 mg/dL — ABNORMAL HIGH (ref 0–99)
NonHDL: 123.45
Total CHOL/HDL Ratio: 3
Triglycerides: 50 mg/dL (ref 0.0–149.0)
VLDL: 10 mg/dL (ref 0.0–40.0)

## 2024-01-16 LAB — HEMOGLOBIN A1C: Hgb A1c MFr Bld: 5.1 % (ref 4.6–6.5)

## 2024-01-24 ENCOUNTER — Other Ambulatory Visit (HOSPITAL_COMMUNITY)
Admission: RE | Admit: 2024-01-24 | Discharge: 2024-01-24 | Disposition: A | Discharge status: Home / Self Care | Source: Ambulatory Visit | Attending: Obstetrics and Gynecology | Admitting: Obstetrics and Gynecology

## 2024-01-24 ENCOUNTER — Encounter: Payer: Self-pay | Admitting: Obstetrics and Gynecology

## 2024-01-24 ENCOUNTER — Ambulatory Visit: Admitting: Obstetrics and Gynecology

## 2024-01-24 VITALS — BP 112/78 | HR 105 | Ht 63.5 in | Wt 145.0 lb

## 2024-01-24 DIAGNOSIS — Z01419 Encounter for gynecological examination (general) (routine) without abnormal findings: Secondary | ICD-10-CM | POA: Insufficient documentation

## 2024-01-24 DIAGNOSIS — Z1331 Encounter for screening for depression: Secondary | ICD-10-CM

## 2024-01-24 NOTE — Progress Notes (Signed)
 33 y.o. y.o. female here for annual exam. Patient's last menstrual period was 01/21/2024 (approximate).    G0 Autism.  Presents accompanied by her mother, but not present for the exam.   RP:  Established patient presenting for annual gyn exam    HPI: Well on Nexplanon  replaced this year.  No menses or BTB.  No pelvic pain.  Normal vaginal secretions.  Currently abstinent.  Pap Neg 12/2020.  No h/o abnormal Pap.  Will repeat Pap every 3-5  years.  Urine and bowel movements normal.  Breasts normal.  Body mass index 18.63 last visit. Body mass index is 25.28 kg/m.     01/24/2024    2:18 PM 01/15/2024    2:14 PM 01/29/2023   12:15 PM  Depression screen PHQ 2/9  Decreased Interest 0 0 1  Down, Depressed, Hopeless 0 0 1  PHQ - 2 Score 0 0 2  Altered sleeping  0 1  Tired, decreased energy  0 2  Change in appetite  0 0  Feeling bad or failure about yourself   0 0  Trouble concentrating  0 0  Moving slowly or fidgety/restless  0 0  Suicidal thoughts  0 0  PHQ-9 Score  0 5   Difficult doing work/chores   Somewhat difficult     Data saved with a previous flowsheet row definition    Blood pressure 112/78, pulse (!) 105, height 5' 3.5 (1.613 m), weight 145 lb (65.8 kg), last menstrual period 01/21/2024, SpO2 97%.     Component Value Date/Time   DIAGPAP  12/09/2020 0937    - Negative for intraepithelial lesion or malignancy (NILM)   ADEQPAP  12/09/2020 0937    Satisfactory for evaluation; transformation zone component PRESENT.    GYN HISTORY:    Component Value Date/Time   DIAGPAP  12/09/2020 0937    - Negative for intraepithelial lesion or malignancy (NILM)   ADEQPAP  12/09/2020 0937    Satisfactory for evaluation; transformation zone component PRESENT.    OB History  Gravida Para Term Preterm AB Living  0 0 0 0 0 0  SAB IAB Ectopic Multiple Live Births  0 0 0 0 0    Past Medical History:  Diagnosis Date   ADHD    Anxiety    Autism    Nexplanon  in place     01/07/24 placed in left arm   OCD (obsessive compulsive disorder)     No past surgical history on file.  Current Outpatient Medications on File Prior to Visit  Medication Sig Dispense Refill   clonazePAM (KLONOPIN) 2 MG tablet Take 1-2 mg by mouth 2 (two) times daily as needed. Takes 1 in the a.m. & 1/2 in the pm     etonogestrel  (NEXPLANON ) 68 MG IMPL implant 1 each by Subdermal route once.     REXULTI 2 MG TABS tablet Take 1-2 mg by mouth every morning. (Patient taking differently: Take 1-2 mg by mouth every morning. Takes 3 mg tablet every morning)     sertraline (ZOLOFT) 100 MG tablet Take 150 mg by mouth every morning.     No current facility-administered medications on file prior to visit.    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    Enjoys watching movies.    Social Drivers of Corporate Investment Banker Strain: Not on file  Food Insecurity: Not on file  Transportation Needs: Not on file  Physical Activity: Not on file  Stress: Not on file  Social Connections: Not on file  Intimate Partner Violence: Not on file    Family History  Problem Relation Age of Onset   Hypertension Father    Diabetes Mellitus II Father    Hyperlipidemia Father    Bipolar disorder Maternal Grandmother      No Known Allergies    Patient's last menstrual period was Patient's last menstrual period was 01/21/2024 (approximate)..             Review of Systems Alls systems reviewed and are negative.     Physical Exam Constitutional:      Appearance: Normal appearance.  Genitourinary:     Vulva and urethral meatus normal.     No lesions in the vagina.     Right Labia: No rash, lesions or skin changes.    Left Labia: No lesions, skin changes or rash.    No vaginal discharge or tenderness.     No vaginal prolapse present.    No vaginal atrophy present.     Right Adnexa: not tender, not palpable and no mass present.    Left Adnexa: not tender, not palpable and no mass present.    No cervical motion tenderness or discharge.     Uterus is not enlarged, tender or irregular.  Breasts:    Right: Normal.     Left: Normal.  HENT:     Head: Normocephalic.  Neck:     Thyroid: No thyroid mass, thyromegaly or thyroid tenderness.  Cardiovascular:     Rate and Rhythm: Normal rate and regular rhythm.     Heart sounds: Normal heart sounds, S1 normal and S2 normal.  Pulmonary:     Effort: Pulmonary effort is normal.     Breath sounds: Normal breath sounds and air entry.  Abdominal:     General: There is no distension.     Palpations: Abdomen is soft. There is no mass.     Tenderness: There is no abdominal tenderness. There is no guarding or rebound.  Musculoskeletal:        General: Normal range of motion.     Cervical back: Full passive range of motion without pain, normal range of motion and neck supple. No tenderness.     Right lower leg: No edema.     Left lower leg: No edema.  Neurological:     Mental Status: She is alert.  Skin:    General: Skin is warm.  Psychiatric:        Mood and Affect: Mood normal.        Behavior: Behavior normal.        Thought Content: Thought content normal.  Vitals and nursing note reviewed. Exam conducted with a chaperone present.    [name], CMA was present for the exam   A:  Well Woman GYN exam                             P:        Pap smear not indicated Encouraged  annual mammogram screening Labs and immunizations to do with PMD Encouraged healthy lifestyle practices Encouraged Vit D and Calcium   No follow-ups on file.  Michelle Wong

## 2024-01-29 ENCOUNTER — Ambulatory Visit: Payer: Self-pay | Admitting: Obstetrics and Gynecology

## 2024-01-29 LAB — CYTOLOGY - PAP
Adequacy: ABSENT
Diagnosis: NEGATIVE

## 2024-07-07 ENCOUNTER — Ambulatory Visit
